# Patient Record
Sex: Female | Born: 1985 | Race: Black or African American | Hispanic: No | Marital: Single | Smoking: Never smoker
Health system: Southern US, Community
[De-identification: ages and names within clinical notes are randomized; demographics above are authoritative.]

## PROBLEM LIST (undated history)

## (undated) DIAGNOSIS — J45909 Unspecified asthma, uncomplicated: Secondary | ICD-10-CM

## (undated) DIAGNOSIS — D259 Leiomyoma of uterus, unspecified: Secondary | ICD-10-CM

## (undated) DIAGNOSIS — I10 Essential (primary) hypertension: Secondary | ICD-10-CM

## (undated) DIAGNOSIS — E0590 Thyrotoxicosis, unspecified without thyrotoxic crisis or storm: Secondary | ICD-10-CM

## (undated) DIAGNOSIS — G43909 Migraine, unspecified, not intractable, without status migrainosus: Secondary | ICD-10-CM

## (undated) HISTORY — PX: NO PAST SURGERIES: SHX2092

## (undated) HISTORY — DX: Unspecified asthma, uncomplicated: J45.909

## (undated) HISTORY — DX: Thyrotoxicosis, unspecified without thyrotoxic crisis or storm: E05.90

## (undated) HISTORY — PX: WISDOM TOOTH EXTRACTION: SHX21

## (undated) HISTORY — DX: Migraine, unspecified, not intractable, without status migrainosus: G43.909

## (undated) HISTORY — DX: Essential (primary) hypertension: I10

---

## 2005-01-01 ENCOUNTER — Emergency Department (HOSPITAL_COMMUNITY): Admission: EM | Admit: 2005-01-01 | Discharge: 2005-01-01 | Payer: Self-pay | Admitting: *Deleted

## 2007-02-23 ENCOUNTER — Emergency Department (HOSPITAL_COMMUNITY): Admission: EM | Admit: 2007-02-23 | Discharge: 2007-02-24 | Payer: Self-pay | Admitting: Emergency Medicine

## 2008-06-16 ENCOUNTER — Emergency Department (HOSPITAL_COMMUNITY): Admission: EM | Admit: 2008-06-16 | Discharge: 2008-06-16 | Payer: Self-pay | Admitting: Emergency Medicine

## 2010-09-11 LAB — STREP A DNA PROBE: Group A Strep Probe: NEGATIVE

## 2010-09-11 LAB — RAPID STREP SCREEN (MED CTR MEBANE ONLY): Streptococcus, Group A Screen (Direct): NEGATIVE

## 2011-03-08 LAB — I-STAT 8, (EC8 V) (CONVERTED LAB)
Acid-base deficit: 1
BUN: 6
Bicarbonate: 25.6 — ABNORMAL HIGH
Chloride: 104
Glucose, Bld: 125 — ABNORMAL HIGH
HCT: 42
Hemoglobin: 14.3
Operator id: 277751
Potassium: 3.6
Sodium: 139
TCO2: 27
pCO2, Ven: 47.7
pH, Ven: 7.337 — ABNORMAL HIGH

## 2011-03-08 LAB — DIFFERENTIAL
Basophils Absolute: 0.1
Basophils Relative: 1
Eosinophils Absolute: 0.3
Eosinophils Relative: 3
Lymphocytes Relative: 13
Lymphs Abs: 1.4
Monocytes Absolute: 1.2 — ABNORMAL HIGH
Monocytes Relative: 11
Neutro Abs: 7.9 — ABNORMAL HIGH
Neutrophils Relative %: 73

## 2011-03-08 LAB — POCT I-STAT CREATININE
Creatinine, Ser: 1
Operator id: 277751

## 2011-03-08 LAB — CBC
HCT: 38.2
Hemoglobin: 12.7
MCHC: 33.2
MCV: 80
Platelets: 250
RBC: 4.78
RDW: 14.3 — ABNORMAL HIGH
WBC: 10.9 — ABNORMAL HIGH

## 2014-07-09 ENCOUNTER — Other Ambulatory Visit: Payer: Self-pay | Admitting: Family Medicine

## 2014-07-09 DIAGNOSIS — E049 Nontoxic goiter, unspecified: Secondary | ICD-10-CM

## 2014-07-13 ENCOUNTER — Ambulatory Visit
Admission: RE | Admit: 2014-07-13 | Discharge: 2014-07-13 | Disposition: A | Discharge status: Home / Self Care | Payer: Self-pay | Source: Ambulatory Visit | Attending: Family Medicine | Admitting: Family Medicine

## 2014-07-13 DIAGNOSIS — E049 Nontoxic goiter, unspecified: Secondary | ICD-10-CM

## 2014-07-21 ENCOUNTER — Ambulatory Visit (INDEPENDENT_AMBULATORY_CARE_PROVIDER_SITE_OTHER): Payer: BLUE CROSS/BLUE SHIELD | Admitting: Internal Medicine

## 2014-07-21 ENCOUNTER — Encounter: Payer: Self-pay | Admitting: Internal Medicine

## 2014-07-21 VITALS — BP 140/91 | HR 83 | Temp 99.2°F | Ht 64.0 in | Wt 193.2 lb

## 2014-07-21 DIAGNOSIS — E0590 Thyrotoxicosis, unspecified without thyrotoxic crisis or storm: Secondary | ICD-10-CM

## 2014-07-21 LAB — T3, FREE: T3, Free: 3.1 pg/mL (ref 2.3–4.2)

## 2014-07-21 LAB — T4, FREE: Free T4: 0.71 ng/dL (ref 0.60–1.60)

## 2014-07-21 LAB — TSH: TSH: 0.11 u[IU]/mL — ABNORMAL LOW (ref 0.35–4.50)

## 2014-07-21 NOTE — Patient Instructions (Signed)
Please stop at the lab.  Please come back for a follow-up appointment in 4 months.  Please let me know if you develop the following symptoms:  Hyperthyroidism The thyroid is a large gland located in the lower front part of your neck. The thyroid helps control metabolism. Metabolism is how your body uses food. It controls metabolism with the hormone thyroxine. When the thyroid is overactive, it produces too much hormone. When this happens, these following problems may occur:   Nervousness  Heat intolerance  Weight loss (in spite of increase food intake)  Diarrhea  Change in hair or skin texture  Palpitations (heart skipping or having extra beats)  Tachycardia (rapid heart rate)  Loss of menstruation (amenorrhea)  Shaking of the hands CAUSES  Grave's Disease (the immune system attacks the thyroid gland). This is the most common cause.  Inflammation of the thyroid gland.  Tumor (usually benign) in the thyroid gland or elsewhere.  Excessive use of thyroid medications (both prescription and 'natural').  Excessive ingestion of Iodine. DIAGNOSIS  To prove hyperthyroidism, your caregiver may do blood tests and ultrasound tests. Sometimes the signs are hidden. It may be necessary for your caregiver to watch this illness with blood tests, either before or after diagnosis and treatment. TREATMENT Short-term treatment There are several treatments to control symptoms. Drugs called beta blockers may give some relief. Drugs that decrease hormone production will provide temporary relief in many people. These measures will usually not give permanent relief. Definitive therapy There are treatments available which can be discussed between you and your caregiver which will permanently treat the problem. These treatments range from surgery (removal of the thyroid), to the use of radioactive iodine (destroys the thyroid by radiation), to the use of antithyroid drugs (interfere with hormone  synthesis). The first two treatments are permanent and usually successful. They most often require hormone replacement therapy for life. This is because it is impossible to remove or destroy the exact amount of thyroid required to make a person euthyroid (normal). HOME CARE INSTRUCTIONS  See your caregiver if the problems you are being treated for get worse. Examples of this would be the problems listed above. SEEK MEDICAL CARE IF: Your general condition worsens. MAKE SURE YOU:   Understand these instructions.  Will watch your condition.  Will get help right away if you are not doing well or get worse. Document Released: 05/14/2005 Document Revised: 08/06/2011 Document Reviewed: 09/25/2006 [facility] Patient Information 2015 [place], [place]. This information is not intended to replace advice given to you by your health care provider. Make sure you discuss any questions you have with your health care provider.

## 2014-07-21 NOTE — Progress Notes (Signed)
Patient ID: Donna Bradshaw, female   DOB: 04/27/1986, 29 y.o.   MRN: [id number]   HPI  Donna Bradshaw is a 29 y.o.-year-old female, referred by her PCP, Dr. Leighton Ruff, for evaluation for thyrotoxicosis.  She had a neck pressure sensation x 2 weeks, then saw PCP. Pt denies feeling nodules in neck, hoarseness, dysphagia/odynophagia, SOB with lying down. No previous URI.  I reviewed pt's thyroid tests: 07/08/2014: TSH 0.03, free T4 2.0 (0.61-1.12)   Patient was sent for thyroid ultrasound (07/13/2014): Prominent thyroid without nodule or other focal lesions. The right lobe was heterogeneous, mildly hyperemic.   she c/o: - + insomnia - now resolved - + fatigue - now resolved - + excessive sweating/heat intolerance - now resolved - no tremors - no anxiety - no palpitations - no hyperdefecation - no weight loss  Pt does not have a FH of thyroid ds. No FH of thyroid cancer. No h/o radiation tx to head or neck.  No seaweed or kelp, no recent contrast studies. No steroid use. No herbal supplements. On Hair skin and nails - not in the day that she had the tests drawn.   ROS: Constitutional: no weight gain/loss, no fatigue, no subjective hyperthermia/hypothermia, + clears her throat often (for a long time, not new) Eyes: no blurry vision, no xerophthalmia ENT: no sore throat, no nodules palpated in throat, no dysphagia/odynophagia, no hoarseness Cardiovascular: no CP/SOB/palpitations/leg swelling Respiratory: no cough/SOB Gastrointestinal: no N/V/D/C Musculoskeletal: no muscle/joint aches Skin: no rashes Neurological: no tremors/numbness/tingling/dizziness Psychiatric: no depression/anxiety  No past medical history.  History reviewed. No pertinent past surgical history.   History   Social History  . Marital Status: Single    Spouse Name: N/A  . Number of Children: 0   Occupational History  . Pharm tech.   Social History Main Topics  . Smoking status: Never Smoker   .  Smokeless tobacco: Not on file  . Alcohol Use: 0.0 oz/week    0 Standard drinks or equivalent per week     Comment: socially  . Drug Use: No   Current Outpatient Rx  Name  Route  Sig  Dispense  Refill  . BIOTIN PO   Oral   Take by mouth. Does not know mg but take tid         . ibuprofen (ADVIL,MOTRIN) 200 MG tablet   Oral   Take 200 mg by mouth every 6 (six) hours as needed.         Marland Kitchen MICROGESTIN 1-20 MG-MCG tablet   Oral   Take 1 tablet by mouth daily.            Dispense as written.   . SUMAtriptan (IMITREX) 100 MG tablet   Oral   Take 100 mg by mouth every 2 (two) hours as needed.           No Known Allergies   Family History  Problem Relation Age of Onset  . Diabetes Maternal Uncle   . Diabetes Maternal Grandmother    PE: BP 140/91 mmHg  Pulse 83  Temp(Src) 99.2 F (37.3 C) (Oral)  Ht 5\' 4"  (1.626 m)  Wt 193 lb 3.2 oz (87.635 kg)  BMI 33.15 kg/m2  LMP 07/18/2014 Wt Readings from Last 3 Encounters:  07/21/14 193 lb 3.2 oz (87.635 kg)   Constitutional: overweight, in NAD Eyes: PERRLA, EOMI, no exophthalmos, no lid lag, no stare ENT: moist mucous membranes, no thyromegaly, no thyroid bruits, no cervical lymphadenopathy Cardiovascular: RRR, No MRG Respiratory: CTA  B Gastrointestinal: abdomen soft, NT, ND, BS+ Musculoskeletal: no deformities, strength intact in all 4 Skin: moist, warm, no rashes Neurological: no tremor with outstretched hands, DTR normal in all 4  ASSESSMENT: 1. Thyrotoxicosis  PLAN:  1. Patient with a recently found low TSH + high fT4, with few thyrotoxic sxs: heat intolerance, insomnia, fatigue, now all resolved. She also had pressure in her neck (that prompted a thyroid ultrasound) >> now resolved. The thyroid ultrasound did not show any abnormalities, but the lobes had increased vascularity. - she does not appear to have exogenous causes for the low TSH.  - We discussed that possible causes of thyrotoxicosis are:  Graves ds     Thyroiditis  (I suspect this) toxic multinodular goiter/ toxic adenoma (I cannot feel nodules at palpation of her thyroid). - I suggested that we check the TSH, fT3 and fT4 and also thyroid stimulating antibodies to screen for Graves' disease.  - If the tests remain abnormal, we may need an uptake and scan to differentiate between the 3 above possible etiologies  - we discussed about possible modalities of treatment for the above conditions, to include methimazole use, radioactive iodine ablation or (last resort) surgery. - I do not feel that we need to add beta blockers at this time, since she is not tachycardic, anxious, or tremulous - I advised her to join my chart to communicate easier - I advised her not to get pregnant until her thyroid tests return to normal. No plans for pregnancy for now. - RTC in 4 months, but likely sooner for repeat labs  Office Visit on 07/21/2014  Component Date Value Ref Range Status  . TSH 07/21/2014 0.11* 0.35 - 4.50 uIU/mL Final  . Free T4 07/21/2014 0.71  0.60 - 1.60 ng/dL Final  . T3, Free 07/21/2014 3.1  2.3 - 4.2 pg/mL Final   Thyroid tests much improved, consistent with a suspected diagnosis of thyroiditis. We'll need to repeat the tests in 6 weeks. No need for a thyroid uptake and scan for now.

## 2014-07-24 LAB — THYROID STIMULATING IMMUNOGLOBULIN: TSI: 26 % baseline (ref <140)

## 2015-03-25 ENCOUNTER — Other Ambulatory Visit: Payer: Self-pay | Admitting: Family Medicine

## 2015-03-25 ENCOUNTER — Other Ambulatory Visit (HOSPITAL_COMMUNITY)
Admission: RE | Admit: 2015-03-25 | Discharge: 2015-03-25 | Disposition: A | Discharge status: Home / Self Care | Payer: BLUE CROSS/BLUE SHIELD | Source: Ambulatory Visit | Attending: Family Medicine | Admitting: Family Medicine

## 2015-03-25 DIAGNOSIS — Z113 Encounter for screening for infections with a predominantly sexual mode of transmission: Secondary | ICD-10-CM | POA: Insufficient documentation

## 2015-03-25 DIAGNOSIS — Z124 Encounter for screening for malignant neoplasm of cervix: Secondary | ICD-10-CM | POA: Diagnosis present

## 2015-03-29 LAB — CYTOLOGY - PAP

## 2015-06-08 ENCOUNTER — Other Ambulatory Visit: Payer: Self-pay | Admitting: Family Medicine

## 2015-06-08 DIAGNOSIS — R102 Pelvic and perineal pain: Secondary | ICD-10-CM

## 2015-06-13 ENCOUNTER — Encounter: Payer: Self-pay | Admitting: *Deleted

## 2015-06-14 ENCOUNTER — Encounter: Payer: Self-pay | Admitting: Neurology

## 2015-06-14 ENCOUNTER — Telehealth: Payer: Self-pay | Admitting: Neurology

## 2015-06-14 ENCOUNTER — Ambulatory Visit (INDEPENDENT_AMBULATORY_CARE_PROVIDER_SITE_OTHER): Payer: BLUE CROSS/BLUE SHIELD | Admitting: Neurology

## 2015-06-14 ENCOUNTER — Encounter: Payer: Self-pay | Admitting: *Deleted

## 2015-06-14 VITALS — BP 141/101 | HR 91 | Ht 64.0 in | Wt 197.6 lb

## 2015-06-14 DIAGNOSIS — G43909 Migraine, unspecified, not intractable, without status migrainosus: Secondary | ICD-10-CM

## 2015-06-14 DIAGNOSIS — G4489 Other headache syndrome: Secondary | ICD-10-CM | POA: Diagnosis not present

## 2015-06-14 DIAGNOSIS — H539 Unspecified visual disturbance: Secondary | ICD-10-CM | POA: Diagnosis not present

## 2015-06-14 DIAGNOSIS — G43009 Migraine without aura, not intractable, without status migrainosus: Secondary | ICD-10-CM | POA: Insufficient documentation

## 2015-06-14 MED ORDER — ZOLMITRIPTAN 5 MG NA SOLN: 1.0000 | NASAL | Status: DC | PRN

## 2015-06-14 MED ORDER — DICLOFENAC POTASSIUM(MIGRAINE) 50 MG PO PACK: 50.0000 mg | PACK | Freq: Once | ORAL | Status: DC | PRN

## 2015-06-14 MED ORDER — SUMATRIPTAN SUCCINATE 11 MG/NOSEPC NA EXHP: 2.0000 | INHALANT_POWDER | Freq: Once | NASAL | Status: DC

## 2015-06-14 NOTE — Progress Notes (Signed)
[id number]    Provider:  Dr Jaynee Eagles Referring Provider: Leighton Ruff, MD Primary Care Physician:  Gerrit Heck, MD  CC:  migraines  HPI:  Donna Bradshaw is a 30 y.o. female here as a referral from Dr. Drema Dallas for migraines. Migraines since her teenage years. She has throbbing behind the right eyeball and unilateral on the right extending to the neck. He has 5 days of migraines a month. She has nausea, she has vomited in the past. She has to go into a dark room which helps, she is sound sensitive and needs silence to sit still. 7/10 in pain. Unknown triggers except maybe around her period. 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 She takes it then by the end of the day the headache is back. She hates the way it makes her feel, like she is heavy and her chest is heavy. Rizatriptan did not help. No aura. She is having vision changes with the headaches.   Reviewed notes, labs and imaging from outside physicians, which showed: reviewed notes from [facility] physicians. Patient has migraines, h/o hyperthyroidism, menstrual irregularity. Was referred for migraine management. She was evaluated in 06/2014 for thyrotoxicosis, She had a neck pressure sensation x 2 weeks, sh ehad a low TSH and high fT4 with few thyrotoxic sxs: heat intolerance, insomnia, fatigue,which resolved suspected thyroiditis.   Review of Systems: Patient complains of symptoms per HPI as well as the following symptoms: no CP, no SOB. Pertinent negatives per HPI. All others negative.   Social History   Social History  . Marital Status: Single    Spouse Name: N/A  . Number of Children: 0  . 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 Smoking status: Never Smoker   . Smokeless tobacco: Not on file  . Alcohol Use: No  . Drug Use: No  . Sexual Activity: Not on file   Other Topics Concern  . Not on file   Social  History Narrative   Lives w/ sister.   Caffeine use: 1-2 times per week    Family History  Problem Relation Age of Onset  . Diabetes Maternal Uncle   . Diabetes Maternal Grandmother   . Cancer    . Seizures    . Migraines Neg Hx     Past Medical History  Diagnosis Date  . Hyperthyroidism   . Migraine     Past Surgical History  Procedure Laterality Date  . No past surgeries      Current Outpatient Prescriptions  Medication Sig Dispense Refill  . ibuprofen (ADVIL,MOTRIN) 200 MG tablet Take 200 mg by mouth every 6 (six) hours as needed.    Marland Kitchen MICROGESTIN 1-20 MG-MCG tablet Take 1 tablet by mouth daily.     . SUMAtriptan (IMITREX) 100 MG tablet Take 100 mg by mouth every 2 (two) hours as needed.     . Diclofenac Potassium 50 MG PACK Take 50 mg by mouth once as needed. Take once daily as needed with headache onset. Please take with food 2 each 0  . SUMAtriptan Succinate (ONZETRA XSAIL) 11 MG/NOSEPC EXHP Place 2 sprays into the nose once. 8 each 11  . zolmitriptan (ZOMIG) 5 MG nasal solution Place 1 spray into the nose as needed for migraine. 2 Units 0   No current facility-administered medications for this visit.    Allergies as of 06/14/2015  . (No Known  Allergies)    Vitals: BP 141/101 mmHg  Pulse 91  Ht 5\' 4"  (1.626 m)  Wt 197 lb 9.6 oz (89.631 kg)  BMI 33.90 kg/m2 Last Weight:  Wt Readings from Last 1 Encounters:  06/14/15 197 lb 9.6 oz (89.631 kg)   Last Height:   Ht Readings from Last 1 Encounters:  06/14/15 5\' 4"  (1.626 m)   Physical exam: Exam: Gen: NAD, conversant, well nourised, obese, well groomed                     CV: RRR, no MRG. No Carotid Bruits. No peripheral edema, warm, nontender Eyes: Conjunctivae clear without exudates or hemorrhage  Neuro: Detailed Neurologic Exam  Speech:    Speech is normal; fluent and spontaneous with normal comprehension.  Cognition:    The patient is oriented to person, place, and time;     recent and remote  memory intact;     language fluent;     normal attention, concentration,     fund of knowledge Cranial Nerves:    The pupils are equal, round, and reactive to light. The fundi are normal and spontaneous venous pulsations are present. Visual fields are full to finger confrontation. Extraocular movements are intact. Trigeminal sensation is intact and the muscles of mastication are normal. The face is symmetric. The palate elevates in the midline. Hearing intact. Voice is normal. Shoulder shrug is normal. The tongue has normal motion without fasciculations.   Coordination:    Normal finger to nose and heel to shin. Normal rapid alternating movements.   Gait:    Heel-toe and tandem gait are normal.   Motor Observation:    No asymmetry, no atrophy, and no involuntary movements noted. Tone:    Normal muscle tone.    Posture:    Posture is normal. normal erect    Strength:    Strength is V/V in the upper and lower limbs.      Sensation: intact to LT     Reflex Exam:  DTR's:    Deep tendon reflexes in the upper and lower extremities are normal bilaterally.   Toes:    The toes are downgoing bilaterally.   Clonus:    Clonus is absent.       Assessment/Plan:  30 year old with migraines   Remember to drink plenty of fluid, eat healthy meals and do not skip any meals. Try to eat protein with a every meal and eat a healthy snack such as fruit or nuts in between meals. Try to keep a regular sleep-wake schedule and try to exercise daily, particularly in the form of walking, 20-30 minutes a day, if you can.   As far as your medications are concerned, I would like to suggest: Can try Onzetra or Cambia or Zomig at onset of headache. Can repeat in 2 hours if needed, no more than 2 in a day or 2 days a week. Let us know which one works the best. Discussed combination therapy for acute management such as cambia + triptan. Will not start daily preventative at this time. As far as diagnostic  testing: MRI of the brain - Has never had images, discussed and will order MRI of the brain - labs  To prevent or relieve headaches, try the following: Cool Compress. Lie down and place a cool compress on your head.  Avoid headache triggers. If certain foods or odors seem to have triggered your migraines in the past, avoid them. A headache diary  might help you identify triggers.  Include physical activity in your daily routine. Try a daily walk or other moderate aerobic exercise.  Manage stress. Find healthy ways to cope with the stressors, such as delegating tasks on your to-do list.  Practice relaxation techniques. Try deep breathing, yoga, massage and visualization.  Eat regularly. Eating regularly scheduled meals and maintaining a healthy diet might help prevent headaches. Also, drink plenty of fluids.  Follow a regular sleep schedule. Sleep deprivation might contribute to headaches Consider biofeedback. With this mind-body technique, you learn to control certain bodily functions - such as muscle tension, heart rate and blood pressure - to prevent headaches or reduce headache pain.    Proceed to emergency room if you experience new or worsening symptoms or symptoms do not resolve, if you have new neurologic symptoms or if headache is severe, or for any concerning symptom.    Sarina Ill, MD  [facility] [address] [place], [place] [address]  Phone [phone] Fax [phone]

## 2015-06-14 NOTE — Progress Notes (Signed)
Faxed onzetra enrollment plus rx to [facility]: [employer]. Received confirmation. Sent copy to MR.

## 2015-06-14 NOTE — Telephone Encounter (Signed)
Patient declined moving forward with the MRI BRAIN WO for now to to her copay of $884.11. Patient is aware of payment plans [employer] offers. Patient explained that she will think about it and call to schedule later. Thanks!

## 2015-06-14 NOTE — Patient Instructions (Addendum)
Overall you are doing fairly well but I do want to suggest a few things today:   Remember to drink plenty of fluid, eat healthy meals and do not skip any meals. Try to eat protein with a every meal and eat a healthy snack such as fruit or nuts in between meals. Try to keep a regular sleep-wake schedule and try to exercise daily, particularly in the form of walking, 20-30 minutes a day, if you can.   As far as your medications are concerned, I would like to suggest: Can try Onzetra or Cambia or Zomig at onset of headache. Can repeat in 2 hours if needed, no more than 2 in a day or 2 days a week.  As far as diagnostic testing: MRI of the brain   Please also call us for any test results so we can go over those with you on the phone.  My clinical assistant and will answer any of your questions and relay your messages to me and also relay most of my messages to you.   Our phone number is [phone]. We also have an after hours call service for urgent matters and there is a physician on-call for urgent questions. For any emergencies you know to call 911 or go to the nearest emergency room

## 2015-06-15 ENCOUNTER — Ambulatory Visit
Admission: RE | Admit: 2015-06-15 | Discharge: 2015-06-15 | Disposition: A | Discharge status: Home / Self Care | Payer: BLUE CROSS/BLUE SHIELD | Source: Ambulatory Visit | Attending: Family Medicine | Admitting: Family Medicine

## 2015-06-15 ENCOUNTER — Telehealth: Payer: Self-pay | Admitting: *Deleted

## 2015-06-15 DIAGNOSIS — R102 Pelvic and perineal pain: Secondary | ICD-10-CM

## 2015-06-15 LAB — COMPREHENSIVE METABOLIC PANEL
ALT: 10 IU/L (ref 0–32)
AST: 10 IU/L (ref 0–40)
Albumin/Globulin Ratio: 1.3 (ref 1.1–2.5)
Albumin: 3.8 g/dL (ref 3.5–5.5)
Alkaline Phosphatase: 39 IU/L (ref 39–117)
BUN/Creatinine Ratio: 9 (ref 8–20)
BUN: 9 mg/dL (ref 6–20)
Bilirubin Total: 0.5 mg/dL (ref 0.0–1.2)
CO2: 22 mmol/L (ref 18–29)
Calcium: 9.1 mg/dL (ref 8.7–10.2)
Chloride: 103 mmol/L (ref 96–106)
Creatinine, Ser: 1 mg/dL (ref 0.57–1.00)
GFR calc Af Amer: 87 mL/min/{1.73_m2} (ref >59)
GFR calc non Af Amer: 75 mL/min/{1.73_m2} (ref >59)
Globulin, Total: 2.9 g/dL (ref 1.5–4.5)
Glucose: 99 mg/dL (ref 65–99)
Potassium: 5 mmol/L (ref 3.5–5.2)
Sodium: 140 mmol/L (ref 134–144)
Total Protein: 6.7 g/dL (ref 6.0–8.5)

## 2015-06-15 NOTE — Telephone Encounter (Signed)
-----   Message from Melvenia Beam, MD sent at 06/15/2015  8:27 AM EST ----- Labs normal thanks

## 2015-06-15 NOTE — Telephone Encounter (Signed)
Spoke to pt about normal labs per Dr Jaynee Eagles. PT verbalized understanding.

## 2015-06-20 NOTE — Progress Notes (Signed)
Received update from advanced pt services through [place]. PA was approved dated 06/20/15 for onzetra. Copay is 183.99. Savings card brings it down to 58.99.

## 2015-06-20 NOTE — Addendum Note (Signed)
Addended by: Rossie Muskrat L on: 06/20/2015 02:07 PM   Modules accepted: Medications

## 2015-06-21 NOTE — Progress Notes (Signed)
Received notice from [facility] that onzetra xsail aer pow ba approved effective 05/30/2015-06/19/2016.

## 2015-07-11 ENCOUNTER — Other Ambulatory Visit (HOSPITAL_COMMUNITY)
Admission: RE | Admit: 2015-07-11 | Discharge: 2015-07-11 | Disposition: A | Discharge status: Home / Self Care | Payer: BLUE CROSS/BLUE SHIELD | Source: Ambulatory Visit | Attending: Obstetrics and Gynecology | Admitting: Obstetrics and Gynecology

## 2015-07-11 ENCOUNTER — Other Ambulatory Visit: Payer: Self-pay | Admitting: Obstetrics and Gynecology

## 2015-07-11 DIAGNOSIS — Z1151 Encounter for screening for human papillomavirus (HPV): Secondary | ICD-10-CM | POA: Diagnosis present

## 2015-07-12 LAB — CERVICOVAGINAL ANCILLARY ONLY: HPV: NOT DETECTED

## 2015-09-01 ENCOUNTER — Ambulatory Visit (INDEPENDENT_AMBULATORY_CARE_PROVIDER_SITE_OTHER): Payer: BLUE CROSS/BLUE SHIELD | Admitting: Family Medicine

## 2015-09-01 VITALS — BP 118/82 | HR 104 | Temp 98.2°F | Resp 18 | Ht 64.0 in | Wt 190.4 lb

## 2015-09-01 DIAGNOSIS — J038 Acute tonsillitis due to other specified organisms: Secondary | ICD-10-CM

## 2015-09-01 DIAGNOSIS — J0300 Acute streptococcal tonsillitis, unspecified: Secondary | ICD-10-CM | POA: Diagnosis not present

## 2015-09-01 LAB — POCT RAPID STREP A (OFFICE): Rapid Strep A Screen: POSITIVE — AB

## 2015-09-01 MED ORDER — AMOXICILLIN 500 MG PO CAPS: 1000.0000 mg | ORAL_CAPSULE | Freq: Two times a day (BID) | ORAL | Status: DC

## 2015-09-01 NOTE — Progress Notes (Signed)
Subjective:    Patient ID: Donna Bradshaw, female    DOB: Apr 21, 1986, 30 y.o.   MRN: [id number]  09/01/2015  Flu Like Symptoms   HPI This 30 y.o. female presents for evaluation of acute illness. Onset four days ago.  +fever Tmax unsure; +chills/sweats; +body aches.  +HA mild.  +ear pain L.  +ST persistent.  White spots on throat.  No rhinorrhea; no nasal congestion; no cough.  No SOB.  No vomiting; +diarrhea x 2 per day.  Mild pain with swallowing.  Pharmacy tech.  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     Review of Systems  Constitutional: Positive for chills, diaphoresis and fatigue. Negative for fever.  HENT: Positive for ear pain and sore throat. Negative for postnasal drip, rhinorrhea, sinus pressure and trouble swallowing.   Respiratory: Negative for cough and shortness of breath.   Cardiovascular: Negative for chest pain, palpitations and leg swelling.  Gastrointestinal: Positive for diarrhea. Negative for nausea, vomiting, abdominal pain and constipation.  Neurological: Positive for headaches.    Past Medical History  Diagnosis Date  . Hyperthyroidism   . Migraine    Past Surgical History  Procedure Laterality Date  . No past surgeries     No Known Allergies  Social History   Social History  . Marital Status: Single    Spouse Name: N/A  . Number of Children: 0  . 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 Smoking status: Never Smoker   . Smokeless tobacco: Not on file  . Alcohol Use: No  . Drug Use: No  . Sexual Activity: Not on file   Other Topics Concern  . Not on file   Social History Narrative   Lives w/ sister.   Caffeine use: 1-2 times per week   Family History  Problem Relation Age of Onset  . Diabetes Maternal Uncle   . Diabetes Maternal Grandmother   . Cancer    . Seizures    . Migraines Neg Hx        Objective:    BP 118/82 mmHg  Pulse 104  Temp(Src) 98.2 F (36.8 C) (Oral)   Resp 18  Ht 5\' 4"  (1.626 m)  Wt 190 lb 6.4 oz (86.365 kg)  BMI 32.67 kg/m2  SpO2 98%  LMP 08/18/2015 Physical Exam  Constitutional: She is oriented to person, place, and time. She appears well-developed and well-nourished. No distress.  HENT:  Head: Normocephalic and atraumatic.  Right Ear: Tympanic membrane, external ear and ear canal normal.  Left Ear: Tympanic membrane, external ear and ear canal normal.  Mouth/Throat: Uvula is midline and mucous membranes are normal. Oropharyngeal exudate and posterior oropharyngeal erythema present. No posterior oropharyngeal edema or tonsillar abscesses.  Eyes: Conjunctivae are normal. Pupils are equal, round, and reactive to light.  Neck: Normal range of motion. Neck supple.  Cardiovascular: Normal rate, regular rhythm and normal heart sounds.  Exam reveals no gallop and no friction rub.   No murmur heard. Pulmonary/Chest: Effort normal and breath sounds normal. She has no wheezes. She has no rales.  Neurological: She is alert and oriented to person, place, and time.  Skin: She is not diaphoretic.  Psychiatric: She has a normal mood and affect. Her behavior is normal.  Nursing note and vitals reviewed.  Results for orders placed or performed in visit on 09/01/15  POCT rapid strep A  Result Value Ref Range   Rapid Strep  A Screen Positive (A) Negative       Assessment & Plan:   1. Strep tonsillitis   2. Acute tonsillitis due to other specified organisms     Orders Placed This Encounter  Procedures  . POCT rapid strep A   Meds ordered this encounter  Medications  . amoxicillin (AMOXIL) 500 MG capsule    Sig: Take 2 capsules (1,000 mg total) by mouth 2 (two) times daily.    Dispense:  40 capsule    Refill:  0    No Follow-up on file.    Austine Kelsay Elayne Guerin, M.D. [facility] [address] [place], [place]  [address] [phone] phone [phone] fax

## 2015-09-01 NOTE — Patient Instructions (Addendum)
IF you received an x-ray today, you will receive an invoice from [facility]. Please contact [facility] at [phone] with questions or concerns regarding your invoice.   IF you received labwork today, you will receive an invoice from [employer]. Please contact [facility] at [phone] with questions or concerns regarding your invoice.   Our billing staff will not be able to assist you with questions regarding bills from these companies.  You will be contacted with the lab results as soon as they are available. The fastest way to get your results is to activate your My Chart account. Instructions are located on the last page of this paperwork. If you have not heard from [place] regarding the results in 2 weeks, please contact this office.    Strep Throat Strep throat is a bacterial infection of the throat. Your health care provider may call the infection tonsillitis or pharyngitis, depending on whether there is swelling in the tonsils or at the back of the throat. Strep throat is most common during the cold months of the year in children who are 45-65 years of age, but it can happen during any season in people of any age. This infection is spread from person to person (contagious) through coughing, sneezing, or close contact. CAUSES Strep throat is caused by the bacteria called Streptococcus pyogenes. RISK FACTORS This condition is more likely to develop in:  People who spend time in crowded places where the infection can spread easily.  People who have close contact with someone who has strep throat. SYMPTOMS Symptoms of this condition include:  Fever or chills.   Redness, swelling, or pain in the tonsils or throat.  Pain or difficulty when swallowing.  White or yellow spots on the tonsils or throat.  Swollen, tender glands in the neck or under the jaw.  Red rash all over the body (rare). DIAGNOSIS This condition is diagnosed by  performing a rapid strep test or by taking a swab of your throat (throat culture test). Results from a rapid strep test are usually ready in a few minutes, but throat culture test results are available after one or two days. TREATMENT This condition is treated with antibiotic medicine. HOME CARE INSTRUCTIONS Medicines  Take over-the-counter and prescription medicines only as told by your health care provider.  Take your antibiotic as told by your health care provider. Do not stop taking the antibiotic even if you start to feel better.  Have family members who also have a sore throat or fever tested for strep throat. They may need antibiotics if they have the strep infection. Eating and Drinking  Do not share food, drinking cups, or personal items that could cause the infection to spread to other people.  If swallowing is difficult, try eating soft foods until your sore throat feels better.  Drink enough fluid to keep your urine clear or pale yellow. General Instructions  Gargle with a salt-water mixture 3-4 times per day or as needed. To make a salt-water mixture, completely dissolve -1 tsp of salt in 1 cup of warm water.  Make sure that all household members wash their hands well.  Get plenty of rest.  Stay home from school or work until you have been taking antibiotics for 24 hours.  Keep all follow-up visits as told by your health care provider. This is important. SEEK MEDICAL CARE IF:  The glands in your neck continue to get bigger.  You develop a rash, cough, or earache.  You cough up a thick liquid that is green, yellow-brown, or bloody.  You have pain or discomfort that does not get better with medicine.  Your problems seem to be getting worse rather than better.  You have a fever. SEEK IMMEDIATE MEDICAL CARE IF:  You have new symptoms, such as vomiting, severe headache, stiff or painful neck, chest pain, or shortness of breath.  You have severe throat pain,  drooling, or changes in your voice.  You have swelling of the neck, or the skin on the neck becomes red and tender.  You have signs of dehydration, such as fatigue, dry mouth, and decreased urination.  You become increasingly sleepy, or you cannot wake up completely.  Your joints become red or painful.   This information is not intended to replace advice given to you by your health care provider. Make sure you discuss any questions you have with your health care provider.   Document Released: 05/11/2000 Document Revised: 02/02/2015 Document Reviewed: 09/06/2014 Elsevier Interactive Patient Education [employer].

## 2015-09-14 ENCOUNTER — Ambulatory Visit: Payer: BLUE CROSS/BLUE SHIELD | Admitting: Neurology

## 2015-12-19 ENCOUNTER — Encounter (HOSPITAL_COMMUNITY): Payer: Self-pay | Admitting: Obstetrics and Gynecology

## 2015-12-19 DIAGNOSIS — G43909 Migraine, unspecified, not intractable, without status migrainosus: Secondary | ICD-10-CM | POA: Insufficient documentation

## 2015-12-19 DIAGNOSIS — D259 Leiomyoma of uterus, unspecified: Secondary | ICD-10-CM | POA: Insufficient documentation

## 2016-03-13 ENCOUNTER — Encounter (HOSPITAL_COMMUNITY): Payer: Self-pay

## 2016-03-13 ENCOUNTER — Emergency Department (HOSPITAL_COMMUNITY)
Admission: EM | Admit: 2016-03-13 | Discharge: 2016-03-13 | Disposition: A | Discharge status: Home / Self Care | Payer: BLUE CROSS/BLUE SHIELD | Attending: Emergency Medicine | Admitting: Emergency Medicine

## 2016-03-13 ENCOUNTER — Emergency Department (HOSPITAL_COMMUNITY): Payer: BLUE CROSS/BLUE SHIELD

## 2016-03-13 DIAGNOSIS — R079 Chest pain, unspecified: Secondary | ICD-10-CM | POA: Diagnosis present

## 2016-03-13 DIAGNOSIS — E010 Iodine-deficiency related diffuse (endemic) goiter: Secondary | ICD-10-CM | POA: Insufficient documentation

## 2016-03-13 HISTORY — DX: Leiomyoma of uterus, unspecified: D25.9

## 2016-03-13 LAB — I-STAT TROPONIN, ED
Troponin i, poc: 0 ng/mL (ref 0.00–0.08)
Troponin i, poc: 0 ng/mL (ref 0.00–0.08)

## 2016-03-13 LAB — CBC
HCT: 38.4 % (ref 36.0–46.0)
Hemoglobin: 13.3 g/dL (ref 12.0–15.0)
MCH: 29.9 pg (ref 26.0–34.0)
MCHC: 34.6 g/dL (ref 30.0–36.0)
MCV: 86.3 fL (ref 78.0–100.0)
Platelets: 275 10*3/uL (ref 150–400)
RBC: 4.45 MIL/uL (ref 3.87–5.11)
RDW: 12.2 % (ref 11.5–15.5)
WBC: 6.8 10*3/uL (ref 4.0–10.5)

## 2016-03-13 LAB — BASIC METABOLIC PANEL
Anion gap: 4 — ABNORMAL LOW (ref 5–15)
BUN: 5 mg/dL — ABNORMAL LOW (ref 6–20)
CO2: 26 mmol/L (ref 22–32)
Calcium: 8.9 mg/dL (ref 8.9–10.3)
Chloride: 108 mmol/L (ref 101–111)
Creatinine, Ser: 0.84 mg/dL (ref 0.44–1.00)
GFR calc Af Amer: >60 mL/min (ref >60)
GFR calc non Af Amer: >60 mL/min (ref >60)
Glucose, Bld: 86 mg/dL (ref 65–99)
Potassium: 3.9 mmol/L (ref 3.5–5.1)
Sodium: 138 mmol/L (ref 135–145)

## 2016-03-13 LAB — D-DIMER, QUANTITATIVE: D-Dimer, Quant: 0.4 ug/mL-FEU (ref 0.00–0.50)

## 2016-03-13 NOTE — ED Notes (Signed)
EDP at bedside  

## 2016-03-13 NOTE — ED Notes (Signed)
Patient states the chest pain is intermittent and mild and started 2 weeks ago. She also feels the pressure in her throat. Currently the patient is not having any pain.

## 2016-03-13 NOTE — ED Triage Notes (Signed)
Onset 1 week pressure in throat and right chest with movement.  Pt reports some shortness of breath and fatigue.  No cough/cold/fever symptoms or known injury.  Pt reports pressure in throat last year, thyroid levels were elevated, referred to endocrinologist, thyroid level normal and u/s done.

## 2016-03-13 NOTE — ED Provider Notes (Signed)
[facility] DEPT Provider Note   CSN: [id number] Arrival date & time: 03/13/16  1359     History   Chief Complaint Chief Complaint  Patient presents with  . Chest Pain    HPI Donna Bradshaw is a 30 y.o. female.  The history is provided by the patient.  Chest Pain   This is a new problem. Episode onset: 2 weeks ago. The problem occurs constantly (The throat pressure is constant.  The chest comes and goes.  Mostly when up and moving). The pain is associated with exertion. The pain is present in the lateral region (right). The pain is mild. The symptoms are aggravated by exertion. Associated symptoms include cough and shortness of breath. Pertinent negatives include no abdominal pain, no fever and no vomiting. Associated symptoms comments: fatigue. She has tried nothing for the symptoms. The treatment provided no relief. Risk factors include oral contraceptive use.  Pertinent negatives for past medical history include no COPD, no diabetes, no DVT and no MI.    Past Medical History:  Diagnosis Date  . Hyperthyroidism   . Migraine   . Uterine fibroid     Patient Active Problem List   Diagnosis Date Noted  . Migraine without aura 06/14/2015  . Thyrotoxicosis 07/21/2014    Past Surgical History:  Procedure Laterality Date  . NO PAST SURGERIES      OB History    No data available       Home Medications    Prior to Admission medications   Medication Sig Start Date End Date Taking? Authorizing Provider  ibuprofen (ADVIL,MOTRIN) 200 MG tablet Take 200 mg by mouth every 6 (six) hours as needed.   Yes Historical Provider, MD  MICROGESTIN 1-20 MG-MCG tablet Take 1 tablet by mouth daily.  07/20/14  Yes Historical Provider, MD  SUMAtriptan Succinate (ONZETRA XSAIL) 11 MG/NOSEPC EXHP Place 2 sprays into the nose once. 06/14/15  Yes Melvenia Beam, MD  amoxicillin (AMOXIL) 500 MG capsule Take 2 capsules (1,000 mg total) by mouth 2 (two) times daily. Patient not taking:  Reported on 03/13/2016 09/01/15   Wardell Honour, MD  Diclofenac Potassium 50 MG PACK Take 50 mg by mouth once as needed. Take once daily as needed with headache onset. Please take with food Patient not taking: Reported on 03/13/2016 06/14/15   Melvenia Beam, MD  zolmitriptan (ZOMIG) 5 MG nasal solution Place 1 spray into the nose as needed for migraine. Patient not taking: Reported on 03/13/2016 06/14/15   Melvenia Beam, MD    Family History Family History  Problem Relation Age of Onset  . Diabetes Maternal Uncle   . Diabetes Maternal Grandmother   . Cancer    . Seizures    . Migraines Neg Hx     Social History Social History  Substance Use Topics  . Smoking status: Never Smoker  . Smokeless tobacco: Never Used  . Alcohol use 0.0 oz/week     Comment: occ     Allergies   Review of patient's allergies indicates no known allergies.   Review of Systems Review of Systems  Constitutional: Negative for fever.  Respiratory: Positive for cough and shortness of breath.   Cardiovascular: Positive for chest pain.  Gastrointestinal: Negative for abdominal pain and vomiting.  All other systems reviewed and are negative.    Physical Exam Updated Vital Signs BP 125/89   Pulse 82   Temp 98.1 F (36.7 C)   Resp 16   Ht 5\' 3"  (  1.6 m)   LMP 02/26/2016   SpO2 100%   Physical Exam  Constitutional: She appears well-developed and well-nourished. No distress.  HENT:  Head: Normocephalic and atraumatic.  Right Ear: External ear normal.  Left Ear: External ear normal.  Eyes: Conjunctivae are normal. Right eye exhibits no discharge. Left eye exhibits no discharge. No scleral icterus.  Neck: Neck supple. No tracheal deviation present. Thyromegaly present.  Cardiovascular: Normal rate, regular rhythm and intact distal pulses.   Pulmonary/Chest: Effort normal and breath sounds normal. No stridor. No respiratory distress. She has no wheezes. She has no rales.  Abdominal: Soft. Bowel  sounds are normal. She exhibits no distension. There is no tenderness. There is no rebound and no guarding.  Musculoskeletal: She exhibits no edema or tenderness.  Neurological: She is alert. She has normal strength. No cranial nerve deficit (no facial droop, extraocular movements intact, no slurred speech) or sensory deficit. She exhibits normal muscle tone. She displays no seizure activity. Coordination normal.  Skin: Skin is warm and dry. No rash noted.  Psychiatric: She has a normal mood and affect.  Nursing note and vitals reviewed.    ED Treatments / Results  Labs (all labs ordered are listed, but only abnormal results are displayed) Labs Reviewed  BASIC METABOLIC PANEL - Abnormal; Notable for the following:       Result Value   BUN 5 (*)    Anion gap 4 (*)    All other components within normal limits  CBC  Randolm Idol, ED     Radiology Dg Chest 2 View  Result Date: 03/13/2016 CLINICAL DATA:  30 year old female with chest and throat pressure for 2 weeks. Shortness breath at times. Nonsmoker. Asthma as child. Initial encounter. EXAM: CHEST  2 VIEW COMPARISON:  02/23/2007 chest x-ray. FINDINGS: No infiltrate, congestive heart failure or pneumothorax. Mediastinal and cardiac silhouette within normal limits. Minimal curvature thoracic spine convex right. IMPRESSION: No acute pulmonary abnormality. Electronically Signed   By: Genia Del M.D.   On: 03/13/2016 16:04    Procedures Procedures (including critical care time)  Medications Ordered in ED Medications - No data to display   Initial Impression / Assessment and Plan / ED Course  I have reviewed the triage vital signs and the nursing notes.  Pertinent labs & imaging results that were available during my care of the patient were reviewed by me and considered in my medical decision making (see chart for details).  Clinical Course    Patient presented to the emergency room with complaints of chest pain. She is  low risk for cardiac disease. I doubt acute coronary syndrome. Troponin 2 is negative. D-dimer is negative. She does have thyromegaly on exam. She previously had a thyroid ultrasound over 1 year ago.  Results showed prominent thyroid , no nodule..  According to the patient she had outpatient thyroid studies were normal. Discussed outpatient follow-up with her primary doctor. Consider repeat thyroid ultrasound and thyroid studies.  Final Clinical Impressions(s) / ED Diagnoses   Final diagnoses:  Chest pain, unspecified type  Thyromegaly    New Prescriptions New Prescriptions   No medications on file     Dorie Rank, MD 03/13/16 1930

## 2016-03-13 NOTE — Discharge Instructions (Signed)
Follow-up with your primary doctor for further evaluation as we discussed.

## 2016-07-18 ENCOUNTER — Other Ambulatory Visit: Payer: Self-pay | Admitting: Neurology

## 2016-07-18 DIAGNOSIS — G43909 Migraine, unspecified, not intractable, without status migrainosus: Secondary | ICD-10-CM

## 2016-09-17 ENCOUNTER — Ambulatory Visit: Payer: BLUE CROSS/BLUE SHIELD | Admitting: Neurology

## 2016-09-17 ENCOUNTER — Telehealth: Payer: Self-pay

## 2016-09-17 NOTE — Telephone Encounter (Signed)
Pt no-showed her 1 yr f/u appt today.

## 2016-09-18 ENCOUNTER — Encounter: Payer: Self-pay | Admitting: Neurology

## 2016-11-29 ENCOUNTER — Other Ambulatory Visit: Payer: Self-pay | Admitting: Obstetrics & Gynecology

## 2016-11-29 DIAGNOSIS — R922 Inconclusive mammogram: Secondary | ICD-10-CM

## 2016-11-29 DIAGNOSIS — N644 Mastodynia: Secondary | ICD-10-CM

## 2016-12-04 ENCOUNTER — Other Ambulatory Visit: Payer: BLUE CROSS/BLUE SHIELD

## 2016-12-06 ENCOUNTER — Ambulatory Visit
Admission: RE | Admit: 2016-12-06 | Discharge: 2016-12-06 | Disposition: A | Discharge status: Home / Self Care | Payer: BLUE CROSS/BLUE SHIELD | Source: Ambulatory Visit | Attending: Obstetrics & Gynecology | Admitting: Obstetrics & Gynecology

## 2016-12-06 DIAGNOSIS — R922 Inconclusive mammogram: Secondary | ICD-10-CM

## 2016-12-06 DIAGNOSIS — N644 Mastodynia: Secondary | ICD-10-CM

## 2017-02-20 NOTE — Patient Instructions (Signed)
Your procedure is scheduled on:  Tuesday, Oct. 9, 2018  Enter through the [employer] [facility] at:  6:00 AM  Pick up the phone at the desk and dial [phone].  Call this number if you have problems the morning of surgery: [phone].  Remember: Do NOT eat food or drink after:  Midnight Monday  Take these medicines the morning of surgery with a SIP OF WATER:  None  Stop ALL herbal medications at this time  Do NOT smoke the day of surgery.  Do NOT wear jewelry (body piercing), metal hair clips/bobby pins, make-up, artifical eyelashes or nail polish. Do NOT wear lotions, powders, or perfumes.  You may wear deodorant. Do NOT shave for 48 hours prior to surgery. Do NOT bring valuables to the hospital. Contacts, dentures, or bridgework may not be worn into surgery.  Leave suitcase in car.  After surgery it may be brought to your room.  For patients admitted to the hospital, checkout time is 11:00 AM the day of discharge.  Bring a copy of your healthcare power of attorney and living will documents.

## 2017-02-25 ENCOUNTER — Encounter (HOSPITAL_COMMUNITY)
Admission: RE | Admit: 2017-02-25 | Discharge: 2017-02-25 | Disposition: A | Discharge status: Home / Self Care | Payer: BLUE CROSS/BLUE SHIELD | Source: Ambulatory Visit | Attending: Obstetrics & Gynecology | Admitting: Obstetrics & Gynecology

## 2017-02-25 ENCOUNTER — Encounter (HOSPITAL_COMMUNITY): Payer: Self-pay

## 2017-02-25 DIAGNOSIS — Z01812 Encounter for preprocedural laboratory examination: Secondary | ICD-10-CM | POA: Insufficient documentation

## 2017-02-25 LAB — TYPE AND SCREEN
ABO/RH(D): B POS
Antibody Screen: NEGATIVE

## 2017-02-25 LAB — CBC
HCT: 39.3 % (ref 36.0–46.0)
Hemoglobin: 13.7 g/dL (ref 12.0–15.0)
MCH: 30.5 pg (ref 26.0–34.0)
MCHC: 34.9 g/dL (ref 30.0–36.0)
MCV: 87.5 fL (ref 78.0–100.0)
Platelets: 258 10*3/uL (ref 150–400)
RBC: 4.49 MIL/uL (ref 3.87–5.11)
RDW: 12.9 % (ref 11.5–15.5)
WBC: 4.4 10*3/uL (ref 4.0–10.5)

## 2017-02-25 LAB — COMPREHENSIVE METABOLIC PANEL
ALT: 17 U/L (ref 14–54)
AST: 19 U/L (ref 15–41)
Albumin: 3.7 g/dL (ref 3.5–5.0)
Alkaline Phosphatase: 32 U/L — ABNORMAL LOW (ref 38–126)
Anion gap: 6 (ref 5–15)
BUN: 6 mg/dL (ref 6–20)
CO2: 24 mmol/L (ref 22–32)
Calcium: 8.6 mg/dL — ABNORMAL LOW (ref 8.9–10.3)
Chloride: 104 mmol/L (ref 101–111)
Creatinine, Ser: 1.01 mg/dL — ABNORMAL HIGH (ref 0.44–1.00)
GFR calc Af Amer: >60 mL/min (ref >60)
GFR calc non Af Amer: >60 mL/min (ref >60)
Glucose, Bld: 96 mg/dL (ref 65–99)
Potassium: 3.7 mmol/L (ref 3.5–5.1)
Sodium: 134 mmol/L — ABNORMAL LOW (ref 135–145)
Total Bilirubin: 1 mg/dL (ref 0.3–1.2)
Total Protein: 7.2 g/dL (ref 6.5–8.1)

## 2017-02-25 LAB — ABO/RH: ABO/RH(D): B POS

## 2017-03-04 ENCOUNTER — Encounter (HOSPITAL_COMMUNITY): Payer: Self-pay

## 2017-03-04 NOTE — H&P (Signed)
Donna Bradshaw is an 32 y.o. female with symptomatic uterine fibroids desiring surgical management with preservation of fertility.  She reports bulk symptoms with pelvic ache and urinary frequency (nl urinary work up).  Ultrasound shows 498 cc uterine volume with multiple fibroids; largest measuring 10 cm.  Pertinent Gynecological History: Menses: flow is moderate Bleeding: regular Contraception: OCP (estrogen/progesterone) DES exposure: unknown Blood transfusions: none Sexually transmitted diseases: no past history Previous GYN Procedures: n/a  Last mammogram: normal Date: 11/2016 Last pap: normal Date: 11/2016 OB History: G0   Menstrual History: Menarche age: n/a No LMP recorded.    Past Medical History:  Diagnosis Date  . Hyperthyroidism    no longer an issue  . Migraine   . Uterine fibroid     Past Surgical History:  Procedure Laterality Date  . NO PAST SURGERIES    . WISDOM TOOTH EXTRACTION      Family History  Problem Relation Age of Onset  . Diabetes Maternal Uncle   . Diabetes Maternal Grandmother   . Cancer Unknown   . Seizures Unknown   . Breast cancer Paternal Grandmother   . Migraines Neg Hx     Social History:  reports that she has never smoked. She has never used smokeless tobacco. She reports that she drinks alcohol. She reports that she does not use drugs.  Allergies: No Known Allergies  No prescriptions prior to admission.    ROS  There were no vitals taken for this visit. Physical Exam  Constitutional: She is oriented to person, place, and time. She appears well-developed and well-nourished.  GI: Soft. There is no rebound and no guarding.  Neurological: She is alert and oriented to person, place, and time.  Skin: Skin is warm and dry.  Psychiatric: She has a normal mood and affect. Her behavior is normal.    No results found for this or any previous visit (from the past 24 hour(s)).  No results found.  Assessment/Plan: 31yo G0 with  symptomatic uterine leiomyomata -Abdominal myomectomy -Patient is counseled re: risk of bleeding, infection, scarring and damage to surrounding structures.  She understands the need to delivery be C/S at 38 weeks if she becomes pregnant.  She also understands the possibility of growth of new fibroids after the surgery.  All questions were answered and the patient wishes to proceed.  Gracy Ehly, [place] 03/04/2017, 8:48 PM

## 2017-03-05 ENCOUNTER — Encounter (HOSPITAL_COMMUNITY): Payer: Self-pay | Admitting: Emergency Medicine

## 2017-03-05 ENCOUNTER — Encounter (HOSPITAL_COMMUNITY)
Admission: RE | Disposition: A | Discharge status: Home / Self Care | Payer: Self-pay | Source: Ambulatory Visit | Attending: Obstetrics & Gynecology

## 2017-03-05 ENCOUNTER — Inpatient Hospital Stay (HOSPITAL_COMMUNITY): Payer: BLUE CROSS/BLUE SHIELD | Admitting: Certified Registered Nurse Anesthetist

## 2017-03-05 ENCOUNTER — Observation Stay (HOSPITAL_COMMUNITY)
Admission: RE | Admit: 2017-03-05 | Discharge: 2017-03-06 | Disposition: A | Discharge status: Home / Self Care | Payer: BLUE CROSS/BLUE SHIELD | Source: Ambulatory Visit | Attending: Obstetrics & Gynecology | Admitting: Obstetrics & Gynecology

## 2017-03-05 DIAGNOSIS — D251 Intramural leiomyoma of uterus: Secondary | ICD-10-CM | POA: Diagnosis not present

## 2017-03-05 DIAGNOSIS — D252 Subserosal leiomyoma of uterus: Secondary | ICD-10-CM | POA: Diagnosis not present

## 2017-03-05 DIAGNOSIS — Z9889 Other specified postprocedural states: Secondary | ICD-10-CM

## 2017-03-05 DIAGNOSIS — D259 Leiomyoma of uterus, unspecified: Secondary | ICD-10-CM | POA: Diagnosis present

## 2017-03-05 HISTORY — PX: MYOMECTOMY: SHX85

## 2017-03-05 LAB — PREGNANCY, URINE: Preg Test, Ur: NEGATIVE

## 2017-03-05 SURGERY — MYOMECTOMY, ABDOMINAL APPROACH
Anesthesia: General | Site: Abdomen

## 2017-03-05 MED ORDER — SUGAMMADEX SODIUM 200 MG/2ML IV SOLN
INTRAVENOUS | Status: AC
  Filled 2017-03-05: qty 2

## 2017-03-05 MED ORDER — CEFAZOLIN SODIUM-DEXTROSE 2-4 GM/100ML-% IV SOLN
INTRAVENOUS | Status: AC
  Filled 2017-03-05: qty 100

## 2017-03-05 MED ORDER — MIDAZOLAM HCL 2 MG/2ML IJ SOLN
INTRAMUSCULAR | Status: AC
  Filled 2017-03-05: qty 2

## 2017-03-05 MED ORDER — KETOROLAC TROMETHAMINE 30 MG/ML IJ SOLN
INTRAMUSCULAR | Status: AC
  Filled 2017-03-05: qty 1

## 2017-03-05 MED ORDER — SCOPOLAMINE 1 MG/3DAYS TD PT72
1.0000 | MEDICATED_PATCH | Freq: Once | TRANSDERMAL | Status: DC
  Administered 2017-03-05: 1.5 mg via TRANSDERMAL

## 2017-03-05 MED ORDER — FENTANYL CITRATE (PF) 250 MCG/5ML IJ SOLN
INTRAMUSCULAR | Status: AC
  Filled 2017-03-05: qty 5

## 2017-03-05 MED ORDER — LIDOCAINE HCL (CARDIAC) 20 MG/ML IV SOLN
INTRAVENOUS | Status: AC
  Filled 2017-03-05: qty 5

## 2017-03-05 MED ORDER — DEXAMETHASONE SODIUM PHOSPHATE 10 MG/ML IJ SOLN
INTRAMUSCULAR | Status: AC
  Filled 2017-03-05: qty 1

## 2017-03-05 MED ORDER — SIMETHICONE 80 MG PO CHEW
80.0000 mg | CHEWABLE_TABLET | Freq: Four times a day (QID) | ORAL | Status: DC | PRN
  Administered 2017-03-05 – 2017-03-06 (×2): 80 mg via ORAL
  Filled 2017-03-05 (×2): qty 1

## 2017-03-05 MED ORDER — ALUM & MAG HYDROXIDE-SIMETH 200-200-20 MG/5ML PO SUSP: 30.0000 mL | ORAL | Status: DC | PRN

## 2017-03-05 MED ORDER — SCOPOLAMINE 1 MG/3DAYS TD PT72
MEDICATED_PATCH | TRANSDERMAL | Status: AC
  Administered 2017-03-05: 1.5 mg via TRANSDERMAL
  Filled 2017-03-05: qty 1

## 2017-03-05 MED ORDER — KETOROLAC TROMETHAMINE 30 MG/ML IJ SOLN
30.0000 mg | Freq: Four times a day (QID) | INTRAMUSCULAR | Status: DC | PRN
  Administered 2017-03-05: 30 mg via INTRAVENOUS
  Filled 2017-03-05: qty 1

## 2017-03-05 MED ORDER — CEFAZOLIN SODIUM-DEXTROSE 2-4 GM/100ML-% IV SOLN
2.0000 g | INTRAVENOUS | Status: AC
  Administered 2017-03-05: 2 g via INTRAVENOUS

## 2017-03-05 MED ORDER — DOCUSATE SODIUM 100 MG PO CAPS
100.0000 mg | ORAL_CAPSULE | Freq: Two times a day (BID) | ORAL | Status: DC
  Administered 2017-03-05 – 2017-03-06 (×2): 100 mg via ORAL
  Filled 2017-03-05 (×2): qty 1

## 2017-03-05 MED ORDER — PROPOFOL 10 MG/ML IV BOLUS
INTRAVENOUS | Status: AC
  Filled 2017-03-05: qty 20

## 2017-03-05 MED ORDER — PROPOFOL 10 MG/ML IV BOLUS
INTRAVENOUS | Status: DC | PRN
Start: 2017-03-05 — End: 2017-03-05
  Administered 2017-03-05: 200 mg via INTRAVENOUS

## 2017-03-05 MED ORDER — HYDROMORPHONE HCL 1 MG/ML IJ SOLN
0.2000 mg | INTRAMUSCULAR | Status: DC | PRN
  Administered 2017-03-05 (×2): 0.6 mg via INTRAVENOUS
  Filled 2017-03-05 (×2): qty 1

## 2017-03-05 MED ORDER — PROMETHAZINE HCL 25 MG/ML IJ SOLN: 6.2500 mg | INTRAMUSCULAR | Status: DC | PRN

## 2017-03-05 MED ORDER — HYDROMORPHONE HCL 1 MG/ML IJ SOLN
INTRAMUSCULAR | Status: AC
  Filled 2017-03-05: qty 0.5

## 2017-03-05 MED ORDER — ROCURONIUM BROMIDE 100 MG/10ML IV SOLN
INTRAVENOUS | Status: AC
  Filled 2017-03-05: qty 1

## 2017-03-05 MED ORDER — FENTANYL CITRATE (PF) 100 MCG/2ML IJ SOLN
INTRAMUSCULAR | Status: DC | PRN
  Administered 2017-03-05: 50 ug via INTRAVENOUS
  Administered 2017-03-05 (×2): 100 ug via INTRAVENOUS
  Administered 2017-03-05: 150 ug via INTRAVENOUS

## 2017-03-05 MED ORDER — OXYCODONE HCL 5 MG PO TABS: 5.0000 mg | ORAL_TABLET | Freq: Once | ORAL | Status: DC | PRN

## 2017-03-05 MED ORDER — MENTHOL 3 MG MT LOZG: 1.0000 | LOZENGE | OROMUCOSAL | Status: DC | PRN

## 2017-03-05 MED ORDER — HYDROMORPHONE HCL 1 MG/ML IJ SOLN
0.2500 mg | INTRAMUSCULAR | Status: DC | PRN
  Administered 2017-03-05 (×3): 0.5 mg via INTRAVENOUS

## 2017-03-05 MED ORDER — MEPERIDINE HCL 25 MG/ML IJ SOLN: 6.2500 mg | INTRAMUSCULAR | Status: DC | PRN

## 2017-03-05 MED ORDER — LACTATED RINGERS IV SOLN
INTRAVENOUS | Status: DC
  Administered 2017-03-05 (×4): via INTRAVENOUS

## 2017-03-05 MED ORDER — ROCURONIUM BROMIDE 100 MG/10ML IV SOLN
INTRAVENOUS | Status: DC | PRN
Start: 2017-03-05 — End: 2017-03-05
  Administered 2017-03-05: 50 mg via INTRAVENOUS

## 2017-03-05 MED ORDER — OXYCODONE HCL 5 MG/5ML PO SOLN: 5.0000 mg | Freq: Once | ORAL | Status: DC | PRN

## 2017-03-05 MED ORDER — HYDROMORPHONE HCL 1 MG/ML IJ SOLN
INTRAMUSCULAR | Status: AC
  Administered 2017-03-05: 0.5 mg via INTRAVENOUS
  Filled 2017-03-05: qty 0.5

## 2017-03-05 MED ORDER — DEXAMETHASONE SODIUM PHOSPHATE 10 MG/ML IJ SOLN
INTRAMUSCULAR | Status: DC | PRN
  Administered 2017-03-05: 10 mg via INTRAVENOUS

## 2017-03-05 MED ORDER — ONDANSETRON HCL 4 MG/2ML IJ SOLN
INTRAMUSCULAR | Status: DC | PRN
  Administered 2017-03-05: 4 mg via INTRAVENOUS

## 2017-03-05 MED ORDER — ONDANSETRON HCL 4 MG PO TABS: 4.0000 mg | ORAL_TABLET | Freq: Four times a day (QID) | ORAL | Status: DC | PRN

## 2017-03-05 MED ORDER — OXYCODONE-ACETAMINOPHEN 5-325 MG PO TABS
1.0000 | ORAL_TABLET | ORAL | Status: DC | PRN
  Administered 2017-03-05 – 2017-03-06 (×4): 2 via ORAL
  Filled 2017-03-05 (×4): qty 2

## 2017-03-05 MED ORDER — SUGAMMADEX SODIUM 200 MG/2ML IV SOLN
INTRAVENOUS | Status: DC | PRN
  Administered 2017-03-05: 172.4 mg via INTRAVENOUS

## 2017-03-05 MED ORDER — LACTATED RINGERS IV SOLN
INTRAVENOUS | Status: DC
  Administered 2017-03-05 – 2017-03-06 (×2): via INTRAVENOUS

## 2017-03-05 MED ORDER — ONDANSETRON HCL 4 MG/2ML IJ SOLN
INTRAMUSCULAR | Status: AC
  Filled 2017-03-05: qty 2

## 2017-03-05 MED ORDER — ONDANSETRON HCL 4 MG/2ML IJ SOLN: 4.0000 mg | Freq: Four times a day (QID) | INTRAMUSCULAR | Status: DC | PRN

## 2017-03-05 MED ORDER — LIDOCAINE HCL (CARDIAC) 20 MG/ML IV SOLN
INTRAVENOUS | Status: DC | PRN
  Administered 2017-03-05: 100 mg via INTRAVENOUS

## 2017-03-05 MED ORDER — MIDAZOLAM HCL 2 MG/2ML IJ SOLN
INTRAMUSCULAR | Status: DC | PRN
  Administered 2017-03-05: 2 mg via INTRAVENOUS

## 2017-03-05 SURGICAL SUPPLY — 35 items
APL SKNCLS STERI-STRIP NONHPOA (GAUZE/BANDAGES/DRESSINGS) ×1
BARRIER ADHS 3X4 INTERCEED (GAUZE/BANDAGES/DRESSINGS) ×1 IMPLANT
BENZOIN TINCTURE PRP APPL 2/3 (GAUZE/BANDAGES/DRESSINGS) ×2 IMPLANT
BRR ADH 4X3 ABS CNTRL BYND (GAUZE/BANDAGES/DRESSINGS) ×1
CANISTER SUCT 3000ML PPV (MISCELLANEOUS) ×2 IMPLANT
CLOTH BEACON ORANGE TIMEOUT ST (SAFETY) ×2 IMPLANT
CONT PATH 16OZ SNAP LID 3702 (MISCELLANEOUS) ×2 IMPLANT
DRAPE CESAREAN BIRTH W POUCH (DRAPES) ×2 IMPLANT
DRSG OPSITE POSTOP 4X10 (GAUZE/BANDAGES/DRESSINGS) ×2 IMPLANT
DURAPREP 26ML APPLICATOR (WOUND CARE) ×4 IMPLANT
ELECT BLADE 6.5 EXT (BLADE) ×2 IMPLANT
GLOVE BIO SURGEON STRL SZ 6 (GLOVE) ×2 IMPLANT
GLOVE BIOGEL PI IND STRL 6 (GLOVE) ×2 IMPLANT
GLOVE BIOGEL PI IND STRL 7.0 (GLOVE) ×1 IMPLANT
GLOVE BIOGEL PI INDICATOR 6 (GLOVE) ×2
GLOVE BIOGEL PI INDICATOR 7.0 (GLOVE) ×1
GOWN STRL REUS W/TWL LRG LVL3 (GOWN DISPOSABLE) ×6 IMPLANT
HEMOSTAT ARISTA ABSORB 3G PWDR (MISCELLANEOUS) ×1 IMPLANT
NS IRRIG 1000ML POUR BTL (IV SOLUTION) ×2 IMPLANT
PACK ABDOMINAL GYN (CUSTOM PROCEDURE TRAY) ×2 IMPLANT
PAD OB MATERNITY 4.3X12.25 (PERSONAL CARE ITEMS) ×2 IMPLANT
PROTECTOR NERVE ULNAR (MISCELLANEOUS) ×2 IMPLANT
SPONGE LAP 18X18 X RAY DECT (DISPOSABLE) ×4 IMPLANT
STRIP CLOSURE SKIN 1/2X4 (GAUZE/BANDAGES/DRESSINGS) ×2 IMPLANT
SUT PDS AB 0 CT1 27 (SUTURE) IMPLANT
SUT PDS AB 3-0 SH 27 (SUTURE) ×2 IMPLANT
SUT PLAIN 2 0 XLH (SUTURE) IMPLANT
SUT VIC AB 0 CT1 18XCR BRD8 (SUTURE) ×2 IMPLANT
SUT VIC AB 0 CT1 36 (SUTURE) ×8 IMPLANT
SUT VIC AB 0 CT1 8-18 (SUTURE) ×4
SUT VIC AB 2-0 CT1 (SUTURE) ×1 IMPLANT
SUT VIC AB 4-0 KS 27 (SUTURE) ×2 IMPLANT
SUT VICRYL 0 TIES 12 18 (SUTURE) ×1 IMPLANT
TOWEL OR 17X24 6PK STRL BLUE (TOWEL DISPOSABLE) ×4 IMPLANT
TRAY FOLEY CATH SILVER 14FR (SET/KITS/TRAYS/PACK) ×2 IMPLANT

## 2017-03-05 NOTE — Op Note (Signed)
Donna Bradshaw PROCEDURE DATE: 03/05/2017  PREOPERATIVE DIAGNOSIS:  Symptomatic fibroids POSTOPERATIVE DIAGNOSIS:  Symptomatic fibroids SURGEON:  Dr. Linda Hedges ASSISTANT:  Dr. Louretta Shorten OPERATION:  Abdominal myomectomy ANESTHESIA:  General endotracheal.  INDICATIONS: The patient is a 31 y.o.G0 with history of symptomatic uterine fibroids.  The patient made a decision to undergo conservative surgical treatment. On the preoperative visit, the risks, benefits, indications, and alternatives of the procedure were reviewed with the patient.  On the day of surgery, the risks of surgery were again discussed with the patient including but not limited to: bleeding which may require transfusion or reoperation; infection which may require antibiotics; injury to bowel, bladder, ureters or other surrounding organs; need for additional procedures; thromboembolic phenomenon, incisional problems and other postoperative/anesthesia complications. Written informed consent was obtained.    OPERATIVE FINDINGS: An 18 week size fibroid uterus with normal tubes and ovaries bilaterally.  ESTIMATED BLOOD LOSS: 850 ml SPECIMENS:  fibroids sent to pathology COMPLICATIONS:  None immediate.   DESCRIPTION OF PROCEDURE: The patient received intravenous antibiotics and had sequential compression devices applied to her lower extremities while in the preoperative area.   She was taken to the operating room and placed under general anesthesia without difficulty and found to be adequate.The abdomen and perineum were prepped and draped in a sterile manner, and a Foley catheter was inserted into the bladder and attached to constant drainage. After an adequate timeout was performed, a Pfannensteil skin incision was made. This incision was taken down to the fascia using electrocautery with care given to maintain good hemostasis. The fascia was incised in the midline and the fascial incision was then extended bilaterally using  electrocautery without difficulty. The fascia was then dissected off the underlying rectus muscles using blunt and sharp dissection. The rectus muscles were split bluntly in the midline and the peritoneum entered sharply without complication. This peritoneal incision was then extended superiorly and inferiorly with care given to prevent bowel or bladder injury. Upon entry into the abdominal cavity, the upper abdomen was inspected and found to be normal. Attention was then turned to the pelvis. A retractor was placed into the incision, and the bowel was packed away with moist laparotomy sponges. The uterus at this point was noted to be mobilized.  The largest fibroid was posterior right intramural.  Bovie cautery was used to incise the serosa and the fibroid wad dissected out sharply.  The fibroid was removed and the endometrial cavity was entered.  The fibroid bed was closed using 0 vicryl figure of eight stitches in deep endometrial, mid-, and superficial layers.  2-0 vicryl was using a running locked fashion to close serosal layer to hemostasis.  Attention was then turned to removal of three subserosal fibroids which were simply removed with Bovie cautery.  Serosa was closed with 0 vicryl in figure of eight stitches.  There was an anterior left intramural fibroid which was removed after Bovie cautery incision in serosa.  Fibroid was dissected out sharply and with Bovie.  Fibroid bed was closed again in three layers using 0 Vicryl with serosa closed in running locked stitch to hemostasis.  Bladder flap was created sharply to access small subserosal fibroid in lower uterine segment.  This fibroid was removed with Bovie cautery and closed with 0 vicryl in figure of eight stitches. The pelvis was irrigated and hemostasis was reconfirmed at all serosa was hemostatic.  Arista was applied to encourage continued hemostasis.  Intercede was placed on posterior uterine incision.  All laparotomy  sponges and instruments were  removed from the abdomen. The peritoneum was closed with 2-0 Vicryl, and the fascia was closed with 0 Vicryl in a running fashion. The skin was closed with a 4-0 Vicryl subcuticular stitch. Sponge, lap, needle, and instrument counts were correct times two. The patient was taken to the recovery area awake, extubated and in stable condition.

## 2017-03-05 NOTE — Addendum Note (Signed)
Addendum  created 03/05/17 1437 by Jonna Munro, CRNA   Sign clinical note

## 2017-03-05 NOTE — Progress Notes (Signed)
NOS  No c/o.  Tolerating clears.  No N/V.  Pain moderate.  No CP/SOB.  VSS. AF. UOP clear and adequate  Gen: A&O x 3 Abd: soft, ND, dressing c/d Ext: no c/c/e  POD0 s/p abd myomectomy -Add toradol -Adv diet -Ambulate -AM labs pending -D/C IVF and foley in AM  Linda Hedges, DO

## 2017-03-05 NOTE — Progress Notes (Signed)
No change to H&P.  [name], DO 

## 2017-03-05 NOTE — Transfer of Care (Signed)
Immediate Anesthesia Transfer of Care Note  Patient: Donna Bradshaw  Procedure(s) Performed: MYOMECTOMY, ABDOMINAL (N/A Abdomen)  Patient Location: PACU  Anesthesia Type:General  Level of Consciousness: awake, alert  and oriented  Airway & Oxygen Therapy: Patient Spontanous Breathing and Patient connected to nasal cannula oxygen  Post-op Assessment: Report given to RN, Post -op Vital signs reviewed and stable and Patient moving all extremities  Post vital signs: Reviewed and stable  Last Vitals:  Vitals:   03/05/17 0614  BP: (!) 152/103  Pulse: 95  Resp: 16  Temp: 37.4 C  SpO2: 100%    Last Pain:  Vitals:   03/05/17 0614  TempSrc: Oral      Patients Stated Pain Goal: 4 (14/43/15 4008)  Complications: No apparent anesthesia complications

## 2017-03-05 NOTE — Anesthesia Postprocedure Evaluation (Signed)
Anesthesia Post Note  Patient: Donna Bradshaw  Procedure(s) Performed: MYOMECTOMY, ABDOMINAL (N/A Abdomen)     Patient location during evaluation: [facility] Anesthesia Type: General Level of consciousness: awake and alert, oriented and patient cooperative Pain management: pain level controlled Vital Signs Assessment: post-procedure vital signs reviewed and stable Respiratory status: nonlabored ventilation, spontaneous breathing, respiratory function stable and patient connected to nasal cannula oxygen Cardiovascular status: stable Postop Assessment: no apparent nausea or vomiting and adequate PO intake Anesthetic complications: no    Last Vitals:  Vitals:   03/05/17 1030 03/05/17 1130  BP: 130/77 120/75  Pulse: 87 75  Resp: 16 16  Temp: 36.9 C 36.8 C  SpO2: 98% 99%    Last Pain:  Vitals:   03/05/17 1415  TempSrc:   PainSc: 8    Pain Goal: Patients Stated Pain Goal: 4 (03/05/17 8341)               Willa Rough

## 2017-03-05 NOTE — Anesthesia Procedure Notes (Signed)
Procedure Name: Intubation Date/Time: 03/05/2017 7:26 AM Performed by: Hewitt Blade Pre-anesthesia Checklist: Patient identified, Emergency Drugs available, Suction available and Patient being monitored Patient Re-evaluated:Patient Re-evaluated prior to induction Oxygen Delivery Method: Circle system utilized Preoxygenation: Pre-oxygenation with 100% oxygen Induction Type: IV induction Ventilation: Mask ventilation without difficulty Laryngoscope Size: Mac and 3 Grade View: Grade I Tube type: Oral Tube size: 7.0 mm Number of attempts: 1 Airway Equipment and Method: Stylet Placement Confirmation: ETT inserted through vocal cords under direct vision,  positive ETCO2 and breath sounds checked- equal and bilateral Secured at: 21 cm Tube secured with: Tape Dental Injury: Teeth and Oropharynx as per pre-operative assessment

## 2017-03-05 NOTE — Anesthesia Preprocedure Evaluation (Signed)
Anesthesia Evaluation  Patient identified by MRN, date of birth, ID band Patient awake    Reviewed: Allergy & Precautions, NPO status , Patient's Chart, lab work & pertinent test results  History of Anesthesia Complications (+) DIFFICULT IV STICK / SPECIAL LINE  Airway Mallampati: II  TM Distance: >3 FB Neck ROM: Full    Dental no notable dental hx.    Pulmonary neg pulmonary ROS,    Pulmonary exam normal breath sounds clear to auscultation       Cardiovascular negative cardio ROS Normal cardiovascular exam Rhythm:Regular Rate:Normal     Neuro/Psych  Headaches, negative neurological ROS  negative psych ROS   GI/Hepatic negative GI ROS, Neg liver ROS,   Endo/Other  negative endocrine ROS  Renal/GU negative Renal ROS  negative genitourinary   Musculoskeletal negative musculoskeletal ROS (+)   Abdominal   Peds negative pediatric ROS (+)  Hematology negative hematology ROS (+)   Anesthesia Other Findings   Reproductive/Obstetrics negative OB ROS                             Anesthesia Physical Anesthesia Plan  ASA: II  Anesthesia Plan: General   Post-op Pain Management:    Induction: Intravenous  PONV Risk Score and Plan: 3 and Ondansetron, Dexamethasone and Midazolam  Airway Management Planned: Oral ETT  Additional Equipment:   Intra-op Plan:   Post-operative Plan: Extubation in OR  Informed Consent: I have reviewed the patients History and Physical, chart, labs and discussed the procedure including the risks, benefits and alternatives for the proposed anesthesia with the patient or authorized representative who has indicated his/her understanding and acceptance.   Dental advisory given  Plan Discussed with: CRNA  Anesthesia Plan Comments:         Anesthesia Quick Evaluation

## 2017-03-05 NOTE — Anesthesia Postprocedure Evaluation (Signed)
Anesthesia Post Note  Patient: Aleya Durnell  Procedure(s) Performed: MYOMECTOMY, ABDOMINAL (N/A Abdomen)     Patient location during evaluation: PACU Anesthesia Type: General Level of consciousness: awake and alert Pain management: pain level controlled Vital Signs Assessment: post-procedure vital signs reviewed and stable Respiratory status: spontaneous breathing, nonlabored ventilation, respiratory function stable and patient connected to nasal cannula oxygen Cardiovascular status: blood pressure returned to baseline and stable Postop Assessment: no apparent nausea or vomiting Anesthetic complications: no    Last Vitals:  Vitals:   03/05/17 1000 03/05/17 1015  BP: 127/83 121/86  Pulse: 77 88  Resp: 14 20  Temp:    SpO2: 100% 100%    Last Pain:  Vitals:   03/05/17 1000  TempSrc:   PainSc: 4    Pain Goal: Patients Stated Pain Goal: 4 (03/05/17 9371)               Lynda Rainwater

## 2017-03-06 ENCOUNTER — Encounter (HOSPITAL_COMMUNITY): Payer: Self-pay | Admitting: Obstetrics & Gynecology

## 2017-03-06 DIAGNOSIS — D251 Intramural leiomyoma of uterus: Secondary | ICD-10-CM | POA: Diagnosis not present

## 2017-03-06 LAB — CBC
HCT: 22.7 % — ABNORMAL LOW (ref 36.0–46.0)
HCT: 22.2 % — ABNORMAL LOW (ref 36.0–46.0)
Hemoglobin: 8 g/dL — ABNORMAL LOW (ref 12.0–15.0)
Hemoglobin: 7.6 g/dL — ABNORMAL LOW (ref 12.0–15.0)
MCH: 31.1 pg (ref 26.0–34.0)
MCH: 30.4 pg (ref 26.0–34.0)
MCHC: 35.2 g/dL (ref 30.0–36.0)
MCHC: 34.2 g/dL (ref 30.0–36.0)
MCV: 88.3 fL (ref 78.0–100.0)
MCV: 88.8 fL (ref 78.0–100.0)
Platelets: 222 10*3/uL (ref 150–400)
Platelets: 236 10*3/uL (ref 150–400)
RBC: 2.57 MIL/uL — ABNORMAL LOW (ref 3.87–5.11)
RBC: 2.5 MIL/uL — ABNORMAL LOW (ref 3.87–5.11)
RDW: 13.1 % (ref 11.5–15.5)
RDW: 13.1 % (ref 11.5–15.5)
WBC: 10.3 10*3/uL (ref 4.0–10.5)
WBC: 12.2 10*3/uL — ABNORMAL HIGH (ref 4.0–10.5)

## 2017-03-06 MED ORDER — DOCUSATE SODIUM 100 MG PO CAPS: 100.0000 mg | ORAL_CAPSULE | Freq: Two times a day (BID) | ORAL | 0 refills | Status: DC

## 2017-03-06 MED ORDER — OXYCODONE-ACETAMINOPHEN 5-325 MG PO TABS: 1.0000 | ORAL_TABLET | ORAL | 0 refills | Status: DC | PRN

## 2017-03-06 MED ORDER — SIMETHICONE 80 MG PO CHEW: 80.0000 mg | CHEWABLE_TABLET | Freq: Four times a day (QID) | ORAL | Status: DC | PRN

## 2017-03-06 MED ORDER — IBUPROFEN 800 MG PO TABS: 800.0000 mg | ORAL_TABLET | Freq: Four times a day (QID) | ORAL | 1 refills | Status: DC | PRN

## 2017-03-06 NOTE — Progress Notes (Signed)
No current c/o except gas pain.  Incisional pain well controlled with po meds.  No N/V.  Tolerating po.  Ambulating well.  No void yet.  VSS. AF.  Adequate UOP overnight. Hgb 13.7->8  Gen: A&O x 3 Abd: soft, ND, dressing c/d Ext: no c/c/e  POD#1 s/p Abdominal myomectomy -Recheck Hgb at 12 -Consider d/c home this pm if voiding and gas pain improved  Linda Hedges, DO

## 2017-03-06 NOTE — Discharge Summary (Signed)
Physician Discharge Summary  Patient ID: Donna Bradshaw MRN: [id number] DOB/AGE: 33-06-1985 33 y.o.  Admit date: 03/05/2017 Discharge date: 03/06/2017  Admission Diagnoses: Symptomatic uterine leiomyomata  Discharge Diagnoses: SAA Active Problems:   S/P myomectomy   Discharged Condition: good  Hospital Course: Patient was admitted for surgical treatment of uterine fibroids.  Abdominal myomectomy was completed without complication.  Patient did extremely well postoperatively and on POD#1 was ready to be discharged home.  She will f/u in office in 1-2 weeks for incision check.  Consults: None  Significant Diagnostic Studies: labs: Hgb 7.6   Treatments: surgery: abdominal myomectomy  Discharge Exam: Blood pressure (!) 111/59, pulse 80, temperature 99.1 F (37.3 C), temperature source Oral, resp. rate 18, height 5\' 3"  (1.6 m), weight 190 lb (86.2 kg), last menstrual period 02/26/2017, SpO2 100 %. General appearance: alert, cooperative and appears stated age Incision/Wound: Abd: soft, ND  Disposition: 01-Home or Self Care   Allergies as of 03/06/2017   No Known Allergies     Medication List    STOP taking these medications   MICROGESTIN FE 1.5/30 1.5-30 MG-MCG tablet Generic drug:  norethindrone-ethinyl estradiol-iron     TAKE these medications   docusate sodium 100 MG capsule Commonly known as:  COLACE Take 1 capsule (100 mg total) by mouth 2 (two) times daily.   ibuprofen 800 MG tablet Commonly known as:  ADVIL Take 1 tablet (800 mg total) by mouth every 6 (six) hours as needed.   oxyCODONE-acetaminophen 5-325 MG tablet Commonly known as:  PERCOCET/ROXICET Take 1-2 tablets by mouth every 4 (four) hours as needed (moderate to severe pain (when tolerating fluids)).   SUMAtriptan 50 MG tablet Commonly known as:  IMITREX Take 50 mg by mouth every 2 (two) hours as needed for migraine. May repeat in 2 hours if headache persists or recurs.        SignedLinda Hedges 03/06/2017, 1:33 PM

## 2017-03-06 NOTE — Progress Notes (Signed)
Discharge teaching complete with pt. Pt understood all information and did not have any questions. Pt discharged home to family. 

## 2017-03-06 NOTE — Discharge Instructions (Signed)
Call MD for T>100.4, heavy vaginal bleeding, severe abdominal pain, intractable nausea and/or vomiting, or respiratory distress.  Call office to schedule postop appointment in 1-2 weeks.  No driving while taking narcotics.  Pelvic rest x 6 weeks.

## 2017-03-06 NOTE — Progress Notes (Signed)
Voiding without difficulty. Hgb 8->7.6 Will d/c home.  Linda Hedges, DO

## 2017-03-11 ENCOUNTER — Encounter (HOSPITAL_COMMUNITY): Payer: Self-pay | Admitting: Emergency Medicine

## 2017-03-11 ENCOUNTER — Emergency Department (HOSPITAL_COMMUNITY)
Admission: EM | Admit: 2017-03-11 | Discharge: 2017-03-11 | Disposition: A | Discharge status: Home / Self Care | Payer: BLUE CROSS/BLUE SHIELD | Attending: Emergency Medicine | Admitting: Emergency Medicine

## 2017-03-11 DIAGNOSIS — E0590 Thyrotoxicosis, unspecified without thyrotoxic crisis or storm: Secondary | ICD-10-CM | POA: Insufficient documentation

## 2017-03-11 DIAGNOSIS — R51 Headache: Secondary | ICD-10-CM | POA: Diagnosis not present

## 2017-03-11 DIAGNOSIS — R519 Headache, unspecified: Secondary | ICD-10-CM

## 2017-03-11 MED ORDER — PROCHLORPERAZINE EDISYLATE 5 MG/ML IJ SOLN
10.0000 mg | Freq: Once | INTRAMUSCULAR | Status: AC
  Administered 2017-03-11: 10 mg via INTRAVENOUS
  Filled 2017-03-11: qty 2

## 2017-03-11 MED ORDER — ONDANSETRON 4 MG PO TBDP
ORAL_TABLET | ORAL | Status: AC
  Filled 2017-03-11: qty 1

## 2017-03-11 MED ORDER — KETOROLAC TROMETHAMINE 15 MG/ML IJ SOLN
15.0000 mg | Freq: Once | INTRAMUSCULAR | Status: AC
  Administered 2017-03-11: 15 mg via INTRAVENOUS
  Filled 2017-03-11: qty 1

## 2017-03-11 MED ORDER — DIPHENHYDRAMINE HCL 50 MG/ML IJ SOLN
25.0000 mg | Freq: Once | INTRAMUSCULAR | Status: AC
  Administered 2017-03-11: 25 mg via INTRAVENOUS
  Filled 2017-03-11: qty 1

## 2017-03-11 MED ORDER — SODIUM CHLORIDE 0.9 % IV BOLUS (SEPSIS)
1000.0000 mL | Freq: Once | INTRAVENOUS | Status: AC
  Administered 2017-03-11: 1000 mL via INTRAVENOUS

## 2017-03-11 MED ORDER — OXYCODONE-ACETAMINOPHEN 5-325 MG PO TABS
ORAL_TABLET | ORAL | Status: AC
  Filled 2017-03-11: qty 1

## 2017-03-11 MED ORDER — OXYCODONE-ACETAMINOPHEN 5-325 MG PO TABS: 1.0000 | ORAL_TABLET | Freq: Once | ORAL | Status: DC

## 2017-03-11 NOTE — ED Notes (Signed)
Pt verbalized understanding of d/c instructions and has no further questions. VSS, NAD. Pt removed all belongings. Pt d/c home with family driving.

## 2017-03-11 NOTE — ED Triage Notes (Signed)
Pt states she had myomectomy on Tuesday that was completed without complication to remove uterine fibroids and since the procedure she has had a constant headache. Opt has taken percocet and her normal migraine  medication with no relief. Headache is 7/10 at this time.

## 2017-03-13 NOTE — ED Provider Notes (Signed)
[name] Provider Note   CSN: [id number] Arrival date & time: 03/11/17  1155     History   Chief Complaint Chief Complaint  Patient presents with  . Headache    HPI Donna Bradshaw is a 31 y.o. female.  HPI  31 year old female with headache. Should a myomectomy on Tuesday and reports that she's had a persistent headache since then. She has otherwise been recovering well with regards to that procedure. She has a past history of what she calls migraines. She has been taking her usual medications without improvement. No fevers or chills. Mild photophobia. No change in visual acuity. No vomiting. No blood thinners. No neck stiffness.  Past Medical History:  Diagnosis Date  . Hyperthyroidism    no longer an issue  . Migraine   . Uterine fibroid     Patient Active Problem List   Diagnosis Date Noted  . S/P myomectomy 03/05/2017  . Migraine without aura 06/14/2015  . Thyrotoxicosis 07/21/2014    Past Surgical History:  Procedure Laterality Date  . MYOMECTOMY N/A 03/05/2017   Procedure: MYOMECTOMY, ABDOMINAL;  Surgeon: Linda Hedges, DO;  Location: [facility];  Service: Gynecology;  Laterality: N/A;  . NO PAST SURGERIES    . WISDOM TOOTH EXTRACTION      OB History    No data available       Home Medications    Prior to Admission medications   Medication Sig Start Date End Date Taking? Authorizing Provider  docusate sodium (COLACE) 100 MG capsule Take 1 capsule (100 mg total) by mouth 2 (two) times daily. 03/06/17   Morris, Jinny Blossom, DO  ibuprofen (ADVIL,MOTRIN) 800 MG tablet Take 1 tablet (800 mg total) by mouth every 6 (six) hours as needed. 03/06/17   Morris, Jinny Blossom, DO  oxyCODONE-acetaminophen (PERCOCET/ROXICET) 5-325 MG tablet Take 1-2 tablets by mouth every 4 (four) hours as needed (moderate to severe pain (when tolerating fluids)). 03/06/17   Morris, Jinny Blossom, DO  SUMAtriptan (IMITREX) 50 MG tablet Take 50 mg by mouth every 2  (two) hours as needed for migraine. May repeat in 2 hours if headache persists or recurs.    [provider]    Family History Family History  Problem Relation Age of Onset  . Diabetes Maternal Uncle   . Diabetes Maternal Grandmother   . Cancer Unknown   . Seizures Unknown   . Breast cancer Paternal Grandmother   . Migraines Neg Hx     Social History Social History  Substance Use Topics  . Smoking status: Never Smoker  . Smokeless tobacco: Never Used  . Alcohol use 0.0 oz/week     Comment: occ     Allergies   Patient has no known allergies.   Review of Systems Review of Systems   Physical Exam Updated Vital Signs BP 116/78 (BP Location: Right Arm)   Pulse 70   Temp 98.9 F (37.2 C) (Oral)   Resp 16   LMP 02/26/2017   SpO2 100%   Physical Exam  Constitutional: She appears well-developed and well-nourished. No distress.  Laying in bed. Lights off. Nontoxic appearance.  HENT:  Head: Normocephalic and atraumatic.  Eyes: Conjunctivae are normal. Right eye exhibits no discharge. Left eye exhibits no discharge.  Neck: Neck supple.  No neck rigidity  Cardiovascular: Normal rate, regular rhythm and normal heart sounds.  Exam reveals no gallop and no friction rub.   No murmur heard. Pulmonary/Chest: Effort normal and breath sounds normal. No respiratory distress.  Abdominal: Soft. She exhibits no distension. There is no tenderness.  Musculoskeletal: She exhibits no edema or tenderness.  Neurological: She is alert.  Speech clear. Content appropriate. Cranial nerves II through XII intact bilaterally. Strength out of 5 bilateral upper lower extremities.  Skin: Skin is warm and dry.  Psychiatric: She has a normal mood and affect. Her behavior is normal. Thought content normal.  Nursing note and vitals reviewed.    ED Treatments / Results  Labs (all labs ordered are listed, but only abnormal results are displayed) Labs Reviewed - No data to display  EKG   EKG Interpretation None       Radiology No results found.  Procedures Procedures (including critical care time)  Medications Ordered in ED Medications  sodium chloride 0.9 % bolus 1,000 mL (0 mLs Intravenous Stopped 03/11/17 2115)  prochlorperazine (COMPAZINE) injection 10 mg (10 mg Intravenous Given 03/11/17 2008)  ketorolac (TORADOL) 15 MG/ML injection 15 mg (15 mg Intravenous Given 03/11/17 2008)  diphenhydrAMINE (BENADRYL) injection 25 mg (25 mg Intravenous Given 03/11/17 2008)  sodium chloride 0.9 % bolus 1,000 mL (0 mLs Intravenous Stopped 03/11/17 2115)     Initial Impression / Assessment and Plan / ED Course  I have reviewed the triage vital signs and the nursing notes.  Pertinent labs & imaging results that were available during my care of the patient were reviewed by me and considered in my medical decision making (see chart for details).     30 year old female with headache. No trauma. Afebrile. No meningismus. Doubt emergent etiology of her headache. She is symptomatically with marked improvement. Return precautions discussed.  Final Clinical Impressions(s) / ED Diagnoses   Final diagnoses:  Acute nonintractable headache, unspecified headache type    New Prescriptions Discharge Medication List as of 03/11/2017  8:53 PM       Virgel Manifold, MD 03/13/17 1218

## 2017-05-08 ENCOUNTER — Ambulatory Visit (INDEPENDENT_AMBULATORY_CARE_PROVIDER_SITE_OTHER): Payer: BLUE CROSS/BLUE SHIELD | Admitting: Neurology

## 2017-05-08 ENCOUNTER — Encounter: Payer: Self-pay | Admitting: Neurology

## 2017-05-08 DIAGNOSIS — I10 Essential (primary) hypertension: Secondary | ICD-10-CM

## 2017-05-08 DIAGNOSIS — G43009 Migraine without aura, not intractable, without status migrainosus: Secondary | ICD-10-CM

## 2017-05-08 MED ORDER — PROPRANOLOL HCL ER 80 MG PO CP24: 80.0000 mg | ORAL_CAPSULE | Freq: Every day | ORAL | 12 refills | Status: DC

## 2017-05-08 MED ORDER — NARATRIPTAN HCL 2.5 MG PO TABS: 2.5000 mg | ORAL_TABLET | ORAL | 12 refills | Status: DC | PRN

## 2017-05-08 NOTE — Progress Notes (Signed)
[facility]    Provider:  Dr Jaynee Eagles  CC:  migraines  Interval history 05/08/2017: Here for follow up. 2 moderate migraines a month last 2-3 days so 6 migraine days a month. At this time using imitrex pill and works but comes back. She is taking 9    Medications tried: Onzetra, imitrex oral and nasal spray and powder, Cambia, Zomig, Maxalt none of these worked at [employer] [facility] migraine  HPI:  Donna Bradshaw is a 31 y.o. female here as a referral from Dr. Drema Dallas for migraines. Migraines since her teenage years. She has throbbing behind the right eyeball and unilateral on the right extending to the neck. He has 5 days of migraines a month. She has nausea, she has vomited in the past. She has to go into a dark room which helps, she is sound sensitive and needs silence to sit still. 7/10 in pain. Unknown triggers except maybe around her period. 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 She takes it then by the end of the day the headache is back. She hates the way it makes her feel, like she is heavy and her chest is heavy. Rizatriptan did not help. No aura. She is having vision changes with the headaches.   Reviewed notes, labs and imaging from outside physicians, which showed: reviewed notes from [facility] physicians. Patient has migraines, h/o hyperthyroidism, menstrual irregularity. Was referred for migraine management. She was evaluated in 06/2014 for thyrotoxicosis, She had a neck pressure sensation x 2 weeks, sh ehad a low TSH and high fT4 with few thyrotoxic sxs: heat intolerance, insomnia, fatigue,which resolved suspected thyroiditis.   Review of Systems: Patient complains of symptoms per HPI as well as the following symptoms: no CP, no SOB. Pertinent negatives per HPI. All others negative.   Social History   Socioeconomic History  . Marital status: Single    Spouse name: Not on file  . Number of children: 0  . Years of education: 12+  . Highest  education level: Not on file  Social Needs  . Financial resource strain: Not on file  . Food insecurity - worry: Not on file  . Food insecurity - inability: Not on file  . Transportation needs - medical: Not on file  . Transportation needs - non-medical: Not on file  Occupational History  . Occupation: Occupational psychologist  Tobacco Use  . Smoking status: Never Smoker  . Smokeless tobacco: Never Used  Substance and Sexual Activity  . Alcohol use: Yes    Alcohol/week: 0.0 oz    Comment: occ  . Drug use: No  . Sexual activity: Not on file  Other Topics Concern  . Not on file  Social History Narrative   Lives w/ sister.   Caffeine use: 1-2 times per week   Right handed    Family History  Problem Relation Age of Onset  . Diabetes Maternal Uncle   . Diabetes Maternal Grandmother   . Cancer Unknown   . Seizures Unknown   . Breast cancer Paternal Grandmother   . Migraines Neg Hx     Past Medical History:  Diagnosis Date  . Hyperthyroidism    no longer an issue  . Migraine   . Uterine fibroid     Past Surgical History:  Procedure Laterality Date  . MYOMECTOMY N/A 03/05/2017   Procedure: MYOMECTOMY, ABDOMINAL;  Surgeon: Linda Hedges, DO;  Location: [facility];  Service: Gynecology;  Laterality: N/A;  . NO PAST  SURGERIES    . WISDOM TOOTH EXTRACTION      Current Outpatient Medications  Medication Sig Dispense Refill  . ibuprofen (ADVIL,MOTRIN) 800 MG tablet Take 1 tablet (800 mg total) by mouth every 6 (six) hours as needed. 30 tablet 1  . Norethin Ace-Eth Estrad-FE (MICROGESTIN FE 1.5/30 PO) Take 1 tablet by mouth daily.    . SUMAtriptan (IMITREX) 50 MG tablet Take 50 mg by mouth every 2 (two) hours as needed for migraine. May repeat in 2 hours if headache persists or recurs.    . naratriptan (AMERGE) 2.5 MG tablet Take 1 tablet (2.5 mg total) by mouth as needed for migraine. Take one (1) tablet at onset of headache; may repeat once in 2 hours 10 tablet 12  . propranolol ER  (INDERAL LA) 80 MG 24 hr capsule Take 1 capsule (80 mg total) by mouth daily. 30 capsule 12   No current facility-administered medications for this visit.     Allergies as of 05/08/2017  . (No Known Allergies)    Vitals: BP (!) 140/97 (BP Location: Left Arm, Patient Position: Sitting) Comment: pt states that's about how it usually runs  Pulse 90   Ht 5\' 4"  (1.626 m)   Wt 192 lb (87.1 kg)   BMI 32.96 kg/m  Last Weight:  Wt Readings from Last 1 Encounters:  05/08/17 192 lb (87.1 kg)   Last Height:   Ht Readings from Last 1 Encounters:  05/08/17 5\' 4"  (1.626 m)    Physical exam: Exam: Gen: NAD, conversant, well nourised, obese, well groomed                     CV: RRR, no MRG. No Carotid Bruits. No peripheral edema, warm, nontender Eyes: Conjunctivae clear without exudates or hemorrhage  Neuro: Detailed Neurologic Exam  Speech:    Speech is normal; fluent and spontaneous with normal comprehension.  Cognition:    The patient is oriented to person, place, and time;     recent and remote memory intact;     language fluent;     normal attention, concentration,     fund of knowledge Cranial Nerves:    The pupils are equal, round, and reactive to light. The fundi are normal and spontaneous venous pulsations are present. Visual fields are full to finger confrontation. Extraocular movements are intact. Trigeminal sensation is intact and the muscles of mastication are normal. The face is symmetric. The palate elevates in the midline. Hearing intact. Voice is normal. Shoulder shrug is normal. The tongue has normal motion without fasciculations.   Coordination:    Normal finger to nose and heel to shin. Normal rapid alternating movements.   Gait:    Heel-toe and tandem gait are normal.   Motor Observation:    No asymmetry, no atrophy, and no involuntary movements noted. Tone:    Normal muscle tone.    Posture:    Posture is normal. normal erect    Strength:    Strength  is V/V in the upper and lower limbs.      Sensation: intact to LT     Reflex Exam:  DTR's:    Deep tendon reflexes in the upper and lower extremities are normal bilaterally.   Toes:    The toes are downgoing bilaterally.   Clonus:    Clonus is absent.      Assessment/Plan:  31 year old with migraines here for follow up  Amerge(Naratriptan): Please take one tablet at the  onset of your headache. If it does not improve the symptoms please take one additional tablet. Do not take more then 2 tablets in 24hrs. Do not take use more then 2 to 3 times in a week.  May take with 1000mg  tylenol or 800mg  ibuprofen or 550mg  naproxen  Propranolol 80mg  daily (email if increase needed)  Medications tried: Onzetra, imitrex oral and nasal spray and powder, Cambia, Zomig, Maxalt none of these worked at [employer] [facility] migraine  Discussed: To prevent or relieve headaches, try the following: Cool Compress. Lie down and place a cool compress on your head.  Avoid headache triggers. If certain foods or odors seem to have triggered your migraines in the past, avoid them. A headache diary might help you identify triggers.  Include physical activity in your daily routine. Try a daily walk or other moderate aerobic exercise.  Manage stress. Find healthy ways to cope with the stressors, such as delegating tasks on your to-do list.  Practice relaxation techniques. Try deep breathing, yoga, massage and visualization.  Eat regularly. Eating regularly scheduled meals and maintaining a healthy diet might help prevent headaches. Also, drink plenty of fluids.  Follow a regular sleep schedule. Sleep deprivation might contribute to headaches Consider biofeedback. With this mind-body technique, you learn to control certain bodily functions - such as muscle tension, heart rate and blood pressure - to prevent headaches or reduce headache pain.    Proceed to emergency room if you experience new or worsening symptoms or symptoms  do not resolve, if you have new neurologic symptoms or if headache is severe, or for any concerning symptom.   Provided education and documentation from American headache Society toolbox including articles on: chronic migraine medication overuse headache, chronic migraines, prevention of migraines, behavioral and other nonpharmacologic treatments for headache.   Sarina Ill, MD  [facility] [address] [place],  [address]  Phone [phone] Fax [phone]  A total of 25 minutes was spent face-to-face with this patient. Over half this time was spent on counseling patient on the migraine diagnosis and different diagnostic and therapeutic options available.

## 2017-05-08 NOTE — Patient Instructions (Addendum)
Amerge(Naratriptan): Please take one tablet at the onset of your headache. If it does not improve the symptoms please take one additional tablet. Do not take more then 2 tablets in 24hrs. Do not take use more then 2 to 3 times in a week.  May take with 1000mg  tylenol or 800mg  ibuprofen or 550mg  naproxen  Propranolol 80mg  daily (email if increase needed)  Propranolol extended-release capsules What is this medicine? PROPRANOLOL (proe PRAN oh lole) is a beta-blocker. Beta-blockers reduce the workload on the heart and help it to beat more regularly. This medicine is used to treat high blood pressure, heart muscle disease, and prevent chest pain caused by angina. It is also used to prevent migraine headaches. You should not use this medicine to treat a migraine that has already started. This medicine may be used for other purposes; ask your health care provider or pharmacist if you have questions. COMMON BRAND NAME(S): Inderal LA, Inderal XL, InnoPran XL What should I tell my health care provider before I take this medicine? They need to know if you have any of these conditions: -circulation problems, or blood vessel disease -diabetes -history of heart attack or heart disease, vasospastic angina -kidney disease -liver disease -lung or breathing disease, like asthma or emphysema -pheochromocytoma -slow heart rate -thyroid disease -an unusual or allergic reaction to propranolol, other beta-blockers, medicines, foods, dyes, or preservatives -pregnant or trying to get pregnant -breast-feeding How should I use this medicine? Take this medicine by mouth with a glass of water. Follow the directions on the prescription label. Do not crush or chew. Take your doses at regular intervals. Do not take your medicine more often than directed. Do not stop taking except on the advice of your doctor or health care professional. Talk to your pediatrician regarding the use of this medicine in children. Special  care may be needed. Overdosage: If you think you have taken too much of this medicine contact a poison control center or emergency room at once. NOTE: This medicine is only for you. Do not share this medicine with others. What if I miss a dose? If you miss a dose, take it as soon as you can. If it is almost time for your next dose, take only that dose. Do not take double or extra doses. What may interact with this medicine? Do not take this medicine with any of the following medications: -feverfew -phenothiazines like chlorpromazine, mesoridazine, prochlorperazine, thioridazine This medicine may also interact with the following medications: -aluminum hydroxide gel -antipyrine -antiviral medicines for HIV or AIDS -barbiturates like phenobarbital -certain medicines for blood pressure, heart disease, irregular heart beat -cimetidine -ciprofloxacin -diazepam -fluconazole -haloperidol -isoniazid -medicines for cholesterol like cholestyramine or colestipol -medicines for mental depression -medicines for migraine headache like almotriptan, eletriptan, frovatriptan, naratriptan, rizatriptan, sumatriptan, zolmitriptan -NSAIDs, medicines for pain and inflammation, like ibuprofen or naproxen -phenytoin -rifampin -teniposide -theophylline -thyroid medicines -tolbutamide -warfarin -zileuton This list may not describe all possible interactions. Give your health care provider a list of all the medicines, herbs, non-prescription drugs, or dietary supplements you use. Also tell them if you smoke, drink alcohol, or use illegal drugs. Some items may interact with your medicine. What should I watch for while using this medicine? Visit your doctor or health care professional for regular check ups. Contact your doctor right away if your symptoms worsen. Check your blood pressure and pulse rate regularly. Ask your health care professional what your blood pressure and pulse rate should be, and when you  should contact  them. Do not stop taking this medicine suddenly. This could lead to serious heart-related effects. You may get drowsy or dizzy. Do not drive, use machinery, or do anything that needs mental alertness until you know how this drug affects you. Do not stand or sit up quickly, especially if you are an older patient. This reduces the risk of dizzy or fainting spells. Alcohol can make you more drowsy and dizzy. Avoid alcoholic drinks. This medicine can affect blood sugar levels. If you have diabetes, check with your doctor or health care professional before you change your diet or the dose of your diabetic medicine. Do not treat yourself for coughs, colds, or pain while you are taking this medicine without asking your doctor or health care professional for advice. Some ingredients may increase your blood pressure. What side effects may I notice from receiving this medicine? Side effects that you should report to your doctor or health care professional as soon as possible: -allergic reactions like skin rash, itching or hives, swelling of the face, lips, or tongue -breathing problems -changes in blood sugar -cold hands or feet -difficulty sleeping, nightmares -dry peeling skin -hallucinations -muscle cramps or weakness -slow heart rate -swelling of the legs and ankles -vomiting Side effects that usually do not require medical attention (report to your doctor or health care professional if they continue or are bothersome): -change in sex drive or performance -diarrhea -dry sore eyes -hair loss -nausea -weak or tired This list may not describe all possible side effects. Call your doctor for medical advice about side effects. You may report side effects to FDA at 1-800-FDA-1088. Where should I keep my medicine? Keep out of the reach of children. Store at room temperature between 15 and 30 degrees C (59 and 86 degrees F). Protect from light, moisture and freezing. Keep container  tightly closed. Throw away any unused medicine after the expiration date. NOTE: This sheet is a summary. It may not cover all possible information. If you have questions about this medicine, talk to your doctor, pharmacist, or health care provider.  2018 Elsevier/Gold Standard (2013-01-16 14:58:56)   Naratriptan tablets What is this medicine? NARATRIPTAN (NAR a trip tan) is used to treat migraines with or without aura. An aura is a strange feeling or visual disturbance that warns you of an attack. It is not used to prevent migraines. This medicine may be used for other purposes; ask your health care provider or pharmacist if you have questions. COMMON BRAND NAME(S): Amerge What should I tell my health care provider before I take this medicine? They need to know if you have any of these conditions: -bowel disease or colitis -diabetes -family history of heart disease -fast or irregular heart beat -heart or blood vessel disease, angina (chest pain), or previous heart attack -high blood pressure -high cholesterol -history of stroke, transient ischemic attacks (TIAs or mini-strokes), or intracranial bleeding -kidney or liver disease -overweight -poor circulation -postmenopausal or surgical removal of uterus and ovaries -Raynaud's disease -seizure disorder -an unusual or allergic reaction to naratriptan, other medicines, foods, dyes, or preservatives -pregnant or trying to get pregnant -breast-feeding How should I use this medicine? Take this medicine by mouth with a glass of water. Follow the directions on the prescription label. This medicine is taken at the first symptoms of a migraine. It is not for everyday use. If your migraine headache returns after one dose, you can take another dose as directed. You must leave at least 4 hours between doses, and do  not take more than 5 mg in 24 hours. If there is no improvement at all after the first dose, do not take a second dose without talking  to your doctor or health care professional. Do not take your medicine more often than directed. Talk to your pediatrician regarding the use of this medicine in children. Special care may be needed. Overdosage: If you think you have taken too much of this medicine contact a poison control center or emergency room at once. NOTE: This medicine is only for you. Do not share this medicine with others. What if I miss a dose? This does not apply; this medicine is not for regular use. What may interact with this medicine? Do not take this medicine with any of the following medicines: -amphetamine, dextroamphetamine or cocaine -dihydroergotamine, ergotamine, ergoloid mesylates, methysergide, or ergot-type medication - do not take within 24 hours of taking naratriptan -feverfew -MAOIs like Carbex, Eldepryl, Marplan, Nardil, and Parnate - do not take naratriptan within 2 weeks of stopping MAOI therapy -other migraine medicines like almotriptan, eletriptan, sumatriptan, rizatriptan, zolmitriptan - do not take within 24 hours of taking naratriptan -tryptophan This medicine may also interact with the following medications: -birth control pills -medicines for mental depression, anxiety or mood problems This list may not describe all possible interactions. Give your health care provider a list of all the medicines, herbs, non-prescription drugs, or dietary supplements you use. Also tell them if you smoke, drink alcohol, or use illegal drugs. Some items may interact with your medicine. What should I watch for while using this medicine? Only take this medicine for a migraine headache. Take it if you get warning symptoms or at the start of a migraine attack. It is not for regular use to prevent migraine attacks. You may get drowsy or dizzy. Do not drive, use machinery, or do anything that needs mental alertness until you know how this medicine affects you. To reduce dizzy or fainting spells, do not sit or stand up  quickly, especially if you are an older patient. Alcohol can increase drowsiness, dizziness and flushing. Avoid alcoholic drinks. Smoking cigarettes may increase the risk of heart-related side effects from using this medicine. If you take migraine medicines for 10 or more days a month, your migraines may get worse. Keep a diary of headache days and medicine use. Contact your healthcare professional if your migraine attacks occur more frequently. What side effects may I notice from receiving this medicine? Side effects that you should report to your doctor or health care professional as soon as possible: -allergic reactions like skin rash, itching or hives, swelling of the face, lips, or tongue -fast, slow, or irregular heart beat -hallucinations -increased or decreased blood pressure -seizures -severe stomach pain and cramping, bloody diarrhea -signs and symptoms of a blood clot such as breathing problems; changes in vision; chest pain; severe, sudden headache; pain, swelling, warmth in the leg; trouble speaking; sudden numbness or weakness of the face, arm or leg -tingling, pain, or numbness in the face, hands, or feet Side effects that usually do not require medical attention (report to your doctor or health care professional if they continue or are bothersome): -feeling warm, flushing, or redness of the face -headache -muscle cramps, pain -nausea, vomiting -unusually weak or tired This list may not describe all possible side effects. Call your doctor for medical advice about side effects. You may report side effects to FDA at 1-800-FDA-1088. Where should I keep my medicine? Keep out of the reach of  children. Store at room temperature between 20 and 25 degrees C (68 and 77 degrees F). Throw away any unused medicine after the expiration date. NOTE: This sheet is a summary. It may not cover all possible information. If you have questions about this medicine, talk to your doctor, pharmacist, or  health care provider.  2018 Elsevier/Gold Standard (2013-01-13 10:15:15)

## 2017-11-18 ENCOUNTER — Encounter: Payer: Self-pay | Admitting: Neurology

## 2017-11-18 ENCOUNTER — Ambulatory Visit (INDEPENDENT_AMBULATORY_CARE_PROVIDER_SITE_OTHER): Payer: BLUE CROSS/BLUE SHIELD | Admitting: Neurology

## 2017-11-18 VITALS — BP 143/101 | HR 66 | Ht 64.0 in | Wt 194.0 lb

## 2017-11-18 DIAGNOSIS — I10 Essential (primary) hypertension: Secondary | ICD-10-CM | POA: Diagnosis not present

## 2017-11-18 DIAGNOSIS — G43009 Migraine without aura, not intractable, without status migrainosus: Secondary | ICD-10-CM | POA: Diagnosis not present

## 2017-11-18 MED ORDER — PROPRANOLOL HCL ER 120 MG PO CP24: 120.0000 mg | ORAL_CAPSULE | Freq: Every day | ORAL | 11 refills | Status: DC

## 2017-11-18 MED ORDER — CYCLOBENZAPRINE HCL 10 MG PO TABS: 10.0000 mg | ORAL_TABLET | Freq: Three times a day (TID) | ORAL | 6 refills | Status: DC | PRN

## 2017-11-18 NOTE — Progress Notes (Signed)
[facility]    Provider:  Dr Jaynee Eagles  CC:  migraines  Interval history 11/18/2017:  Migraines have been ok, she has not been checking her blood pressure. It has been elevated. When she does get the headaches her amerge works. Contacts may have something to do with migraines. She has neck pain. Discussed taking Amerge right away, she doesn't take it right away. She 5 migraine days a month. Starts as a headache. Caffeine helps.   Interval history 05/08/2017: Here for follow up. 2 moderate migraines a month last 2-3 days so 6 migraine days a month. At this time using imitrex pill and works but comes back. She is taking 9    Medications tried: Onzetra, imitrex oral and nasal spray and powder, Cambia, Zomig, Maxalt none of these worked at [employer] [facility] migraine.   HPI:  Donna Bradshaw is a 32 y.o. female here as a referral from Dr. Drema Dallas for migraines. Migraines since her teenage years. She has throbbing behind the right eyeball and unilateral on the right extending to the neck. He has 5 days of migraines a month. She has nausea, she has vomited in the past. She has to go into a dark room which helps, she is sound sensitive and needs silence to sit still. 7/10 in pain. Unknown triggers except maybe around her period. 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 She takes it then by the end of the day the headache is back. She hates the way it makes her feel, like she is heavy and her chest is heavy. Rizatriptan did not help. No aura. She is having vision changes with the headaches.   Reviewed notes, labs and imaging from outside physicians, which showed: reviewed notes from [facility] physicians. Patient has migraines, h/o hyperthyroidism, menstrual irregularity. Was referred for migraine management. She was evaluated in 06/2014 for thyrotoxicosis, She had a neck pressure sensation x 2 weeks, sh ehad a low TSH and high fT4 with few thyrotoxic sxs: heat intolerance,  insomnia, fatigue,which resolved suspected thyroiditis.   Review of Systems: Patient complains of symptoms per HPI as well as the following symptoms: no CP, no SOB. Pertinent negatives per HPI. All others negative.   Social History   Socioeconomic History  . Marital status: Single    Spouse name: Not on file  . Number of children: 0  . Years of education: 12+  . Highest education level: Not on file  Occupational History  . Occupation: Occupational psychologist  Social Needs  . Financial resource strain: Not on file  . Food insecurity:    Worry: Not on file    Inability: Not on file  . Transportation needs:    Medical: Not on file    Non-medical: Not on file  Tobacco Use  . Smoking status: Never Smoker  . Smokeless tobacco: Never Used  Substance and Sexual Activity  . Alcohol use: Yes    Alcohol/week: 0.0 oz    Comment: occ  . Drug use: No  . Sexual activity: Not on file  Lifestyle  . Physical activity:    Days per week: Not on file    Minutes per session: Not on file  . Stress: Not on file  Relationships  . Social connections:    Talks on phone: Not on file    Gets together: Not on file    Attends religious service: Not on file    Active member of club or organization: Not on file  Attends meetings of clubs or organizations: Not on file    Relationship status: Not on file  . Intimate partner violence:    Fear of current or ex partner: Not on file    Emotionally abused: Not on file    Physically abused: Not on file    Forced sexual activity: Not on file  Other Topics Concern  . Not on file  Social History Narrative   Lives w/ sister.   Caffeine use: 1-2 times per week   Right handed    Family History  Problem Relation Age of Onset  . Diabetes Maternal Uncle   . Diabetes Maternal Grandmother   . Cancer Unknown   . Seizures Unknown   . Breast cancer Paternal Grandmother   . Migraines Neg Hx     Past Medical History:  Diagnosis Date  . Hyperthyroidism    no  longer an issue  . Migraine   . Uterine fibroid     Past Surgical History:  Procedure Laterality Date  . MYOMECTOMY N/A 03/05/2017   Procedure: MYOMECTOMY, ABDOMINAL;  Surgeon: Linda Hedges, DO;  Location: [facility];  Service: Gynecology;  Laterality: N/A;  . NO PAST SURGERIES    . WISDOM TOOTH EXTRACTION      Current Outpatient Medications  Medication Sig Dispense Refill  . ibuprofen (ADVIL,MOTRIN) 200 MG tablet Take 600-800 mg by mouth as needed (for cramps).    . naproxen sodium (ALEVE) 220 MG tablet Take 440 mg by mouth as needed.    . naratriptan (AMERGE) 2.5 MG tablet Take 1 tablet (2.5 mg total) by mouth as needed for migraine. Take one (1) tablet at onset of headache; may repeat once in 2 hours 10 tablet 12  . Norethin Ace-Eth Estrad-FE (MICROGESTIN FE 1.5/30 PO) Take 1 tablet by mouth daily.    . propranolol ER (INDERAL LA) 120 MG 24 hr capsule Take 1 capsule (120 mg total) by mouth at bedtime. 30 capsule 11  . cyclobenzaprine (FLEXERIL) 10 MG tablet Take 1 tablet (10 mg total) by mouth 3 (three) times daily as needed for muscle spasms. And headaches. 90 tablet 6   No current facility-administered medications for this visit.     Allergies as of 11/18/2017  . (No Known Allergies)    Vitals: BP (!) 143/101 (BP Location: Right Arm, Patient Position: Sitting)   Pulse 66   Ht 5\' 4"  (1.626 m)   Wt 194 lb (88 kg)   BMI 33.30 kg/m  Last Weight:  Wt Readings from Last 1 Encounters:  11/18/17 194 lb (88 kg)   Last Height:   Ht Readings from Last 1 Encounters:  11/18/17 5\' 4"  (1.626 m)    Physical exam: Exam: Gen: NAD, conversant, well nourised, obese, well groomed                     CV: RRR, no MRG. No Carotid Bruits. No peripheral edema, warm, nontender Eyes: Conjunctivae clear without exudates or hemorrhage  Neuro: Detailed Neurologic Exam  Speech:    Speech is normal; fluent and spontaneous with normal comprehension.  Cognition:    The patient is oriented to  person, place, and time;     recent and remote memory intact;     language fluent;     normal attention, concentration,     fund of knowledge Cranial Nerves:    The pupils are equal, round, and reactive to light. The fundi are normal and spontaneous venous pulsations are present. Visual fields  are full to finger confrontation. Extraocular movements are intact. Trigeminal sensation is intact and the muscles of mastication are normal. The face is symmetric. The palate elevates in the midline. Hearing intact. Voice is normal. Shoulder shrug is normal. The tongue has normal motion without fasciculations.   Coordination:    Normal finger to nose and heel to shin. Normal rapid alternating movements.   Gait:    Heel-toe and tandem gait are normal.   Motor Observation:    No asymmetry, no atrophy, and no involuntary movements noted. Tone:    Normal muscle tone.    Posture:    Posture is normal. normal erect    Strength:    Strength is V/V in the upper and lower limbs.      Sensation: intact to LT     Reflex Exam:  DTR's:    Deep tendon reflexes in the upper and lower extremities are normal bilaterally.   Toes:    The toes are downgoing bilaterally.   Clonus:    Clonus is absent.      Assessment/Plan:  32 year old with migraines here for follow up  Amerge(Naratriptan): Please take one tablet at the onset of your headache. If it does not improve the symptoms please take one additional tablet. Do not take more then 2 tablets in 24hrs. Do not take use more then 2 to 3 times in a week.  May take with 1000mg  tylenol or 800mg  ibuprofen or 550mg  naproxen  Propranolol 120mg  daily (email if increase needed), refilled and increased  Needs visit to the eye doctor, contacts are worsening migraines  HTN: increse propranolol   Meds ordered this encounter  Medications  . propranolol ER (INDERAL LA) 120 MG 24 hr capsule    Sig: Take 1 capsule (120 mg total) by mouth at bedtime.     Dispense:  30 capsule    Refill:  11  . cyclobenzaprine (FLEXERIL) 10 MG tablet    Sig: Take 1 tablet (10 mg total) by mouth 3 (three) times daily as needed for muscle spasms. And headaches.    Dispense:  90 tablet    Refill:  6    Medications tried: Onzetra, imitrex oral and nasal spray and powder, Cambia, Zomig, Maxalt none of these worked at [employer] [facility] migraine  Discussed: To prevent or relieve headaches, try the following: Cool Compress. Lie down and place a cool compress on your head.  Avoid headache triggers. If certain foods or odors seem to have triggered your migraines in the past, avoid them. A headache diary might help you identify triggers.  Include physical activity in your daily routine. Try a daily walk or other moderate aerobic exercise.  Manage stress. Find healthy ways to cope with the stressors, such as delegating tasks on your to-do list.  Practice relaxation techniques. Try deep breathing, yoga, massage and visualization.  Eat regularly. Eating regularly scheduled meals and maintaining a healthy diet might help prevent headaches. Also, drink plenty of fluids.  Follow a regular sleep schedule. Sleep deprivation might contribute to headaches Consider biofeedback. With this mind-body technique, you learn to control certain bodily functions - such as muscle tension, heart rate and blood pressure - to prevent headaches or reduce headache pain.    Proceed to emergency room if you experience new or worsening symptoms or symptoms do not resolve, if you have new neurologic symptoms or if headache is severe, or for any concerning symptom.   Provided education and documentation from American headache Society toolbox including  articles on: chronic migraine medication overuse headache, chronic migraines, prevention of migraines, behavioral and other nonpharmacologic treatments for headache.   Sarina Ill, MD  [facility] [address] [place],  [place] [address]  Phone [phone] Fax [phone]  A total of 30 minutes was spent face-to-face with this patient. Over half this time was spent on counseling patient on the migraine and HTN diagnosis and different diagnostic and therapeutic options available.

## 2017-11-18 NOTE — Patient Instructions (Addendum)
Email me in 4 weeks and let me know how things are going, monitor blood pressure because we can increase propranolol which is good for migraines and blood pressure.   Cyclobenzaprine tablets What is this medicine? CYCLOBENZAPRINE (sye kloe BEN za preen) is a muscle relaxer. It is used to treat muscle pain, spasms, and stiffness. This medicine may be used for other purposes; ask your health care provider or pharmacist if you have questions. COMMON BRAND NAME(S): Fexmid, Flexeril What should I tell my health care provider before I take this medicine? They need to know if you have any of these conditions: -heart disease, irregular heartbeat, or previous heart attack -liver disease -thyroid problem -an unusual or allergic reaction to cyclobenzaprine, tricyclic antidepressants, lactose, other medicines, foods, dyes, or preservatives -pregnant or trying to get pregnant -breast-feeding How should I use this medicine? Take this medicine by mouth with a glass of water. Follow the directions on the prescription label. If this medicine upsets your stomach, take it with food or milk. Take your medicine at regular intervals. Do not take it more often than directed. Talk to your pediatrician regarding the use of this medicine in children. Special care may be needed. Overdosage: If you think you have taken too much of this medicine contact a poison control center or emergency room at once. NOTE: This medicine is only for you. Do not share this medicine with others. What if I miss a dose? If you miss a dose, take it as soon as you can. If it is almost time for your next dose, take only that dose. Do not take double or extra doses. What may interact with this medicine? Do not take this medicine with any of the following medications: -certain medicines for fungal infections like fluconazole, itraconazole, ketoconazole, posaconazole,  voriconazole -cisapride -dofetilide -dronedarone -halofantrine -levomethadyl -MAOIs like Carbex, Eldepryl, Marplan, Nardil, and Parnate -narcotic medicines for cough -pimozide -thioridazine -ziprasidone This medicine may also interact with the following medications: -alcohol -antihistamines for allergy, cough and cold -certain medicines for anxiety or sleep -certain medicines for cancer -certain medicines for depression like amitriptyline, fluoxetine, sertraline -certain medicines for infection like alfuzosin, chloroquine, clarithromycin, levofloxacin, mefloquine, pentamidine, troleandomycin -certain medicines for irregular heart beat -certain medicines for seizures like phenobarbital, primidone -contrast dyes -general anesthetics like halothane, isoflurane, methoxyflurane, propofol -local anesthetics like lidocaine, pramoxine, tetracaine -medicines that relax muscles for surgery -narcotic medicines for pain -other medicines that prolong the QT interval (cause an abnormal heart rhythm) -phenothiazines like chlorpromazine, mesoridazine, prochlorperazine This list may not describe all possible interactions. Give your health care provider a list of all the medicines, herbs, non-prescription drugs, or dietary supplements you use. Also tell them if you smoke, drink alcohol, or use illegal drugs. Some items may interact with your medicine. What should I watch for while using this medicine? Tell your doctor or health care professional if your symptoms do not start to get better or if they get worse. You may get drowsy or dizzy. Do not drive, use machinery, or do anything that needs mental alertness until you know how this medicine affects you. Do not stand or sit up quickly, especially if you are an older patient. This reduces the risk of dizzy or fainting spells. Alcohol may interfere with the effect of this medicine. Avoid alcoholic drinks. If you are taking another medicine that also  causes drowsiness, you may have more side effects. Give your health care provider a list of all medicines you use. Your doctor will tell  you how much medicine to take. Do not take more medicine than directed. Call emergency for help if you have problems breathing or unusual sleepiness. Your mouth may get dry. Chewing sugarless gum or sucking hard candy, and drinking plenty of water may help. Contact your doctor if the problem does not go away or is severe. What side effects may I notice from receiving this medicine? Side effects that you should report to your doctor or health care professional as soon as possible: -allergic reactions like skin rash, itching or hives, swelling of the face, lips, or tongue -breathing problems -chest pain -fast, irregular heartbeat -hallucinations -seizures -unusually weak or tired Side effects that usually do not require medical attention (report to your doctor or health care professional if they continue or are bothersome): -headache -nausea, vomiting This list may not describe all possible side effects. Call your doctor for medical advice about side effects. You may report side effects to FDA at 1-800-FDA-1088. Where should I keep my medicine? Keep out of the reach of children. Store at room temperature between 15 and 30 degrees C (59 and 86 degrees F). Keep container tightly closed. Throw away any unused medicine after the expiration date. NOTE: This sheet is a summary. It may not cover all possible information. If you have questions about this medicine, talk to your doctor, pharmacist, or health care provider.  2018 Elsevier/Gold Standard (2015-02-22 12:05:46)   Propranolol extended-release capsules What is this medicine? PROPRANOLOL (proe PRAN oh lole) is a beta-blocker. Beta-blockers reduce the workload on the heart and help it to beat more regularly. This medicine is used to treat high blood pressure, heart muscle disease, and prevent chest pain caused  by angina. It is also used to prevent migraine headaches. You should not use this medicine to treat a migraine that has already started. This medicine may be used for other purposes; ask your health care provider or pharmacist if you have questions. COMMON BRAND NAME(S): Inderal LA, Inderal XL, InnoPran XL What should I tell my health care provider before I take this medicine? They need to know if you have any of these conditions: -circulation problems, or blood vessel disease -diabetes -history of heart attack or heart disease, vasospastic angina -kidney disease -liver disease -lung or breathing disease, like asthma or emphysema -pheochromocytoma -slow heart rate -thyroid disease -an unusual or allergic reaction to propranolol, other beta-blockers, medicines, foods, dyes, or preservatives -pregnant or trying to get pregnant -breast-feeding How should I use this medicine? Take this medicine by mouth with a glass of water. Follow the directions on the prescription label. Do not crush or chew. Take your doses at regular intervals. Do not take your medicine more often than directed. Do not stop taking except on the advice of your doctor or health care professional. Talk to your pediatrician regarding the use of this medicine in children. Special care may be needed. Overdosage: If you think you have taken too much of this medicine contact a poison control center or emergency room at once. NOTE: This medicine is only for you. Do not share this medicine with others. What if I miss a dose? If you miss a dose, take it as soon as you can. If it is almost time for your next dose, take only that dose. Do not take double or extra doses. What may interact with this medicine? Do not take this medicine with any of the following medications: -feverfew -phenothiazines like chlorpromazine, mesoridazine, prochlorperazine, thioridazine This medicine may also interact with  the following  medications: -aluminum hydroxide gel -antipyrine -antiviral medicines for HIV or AIDS -barbiturates like phenobarbital -certain medicines for blood pressure, heart disease, irregular heart beat -cimetidine -ciprofloxacin -diazepam -fluconazole -haloperidol -isoniazid -medicines for cholesterol like cholestyramine or colestipol -medicines for mental depression -medicines for migraine headache like almotriptan, eletriptan, frovatriptan, naratriptan, rizatriptan, sumatriptan, zolmitriptan -NSAIDs, medicines for pain and inflammation, like ibuprofen or naproxen -phenytoin -rifampin -teniposide -theophylline -thyroid medicines -tolbutamide -warfarin -zileuton This list may not describe all possible interactions. Give your health care provider a list of all the medicines, herbs, non-prescription drugs, or dietary supplements you use. Also tell them if you smoke, drink alcohol, or use illegal drugs. Some items may interact with your medicine. What should I watch for while using this medicine? Visit your doctor or health care professional for regular check ups. Contact your doctor right away if your symptoms worsen. Check your blood pressure and pulse rate regularly. Ask your health care professional what your blood pressure and pulse rate should be, and when you should contact them. Do not stop taking this medicine suddenly. This could lead to serious heart-related effects. You may get drowsy or dizzy. Do not drive, use machinery, or do anything that needs mental alertness until you know how this drug affects you. Do not stand or sit up quickly, especially if you are an older patient. This reduces the risk of dizzy or fainting spells. Alcohol can make you more drowsy and dizzy. Avoid alcoholic drinks. This medicine can affect blood sugar levels. If you have diabetes, check with your doctor or health care professional before you change your diet or the dose of your diabetic medicine. Do not  treat yourself for coughs, colds, or pain while you are taking this medicine without asking your doctor or health care professional for advice. Some ingredients may increase your blood pressure. What side effects may I notice from receiving this medicine? Side effects that you should report to your doctor or health care professional as soon as possible: -allergic reactions like skin rash, itching or hives, swelling of the face, lips, or tongue -breathing problems -changes in blood sugar -cold hands or feet -difficulty sleeping, nightmares -dry peeling skin -hallucinations -muscle cramps or weakness -slow heart rate -swelling of the legs and ankles -vomiting Side effects that usually do not require medical attention (report to your doctor or health care professional if they continue or are bothersome): -change in sex drive or performance -diarrhea -dry sore eyes -hair loss -nausea -weak or tired This list may not describe all possible side effects. Call your doctor for medical advice about side effects. You may report side effects to FDA at 1-800-FDA-1088. Where should I keep my medicine? Keep out of the reach of children. Store at room temperature between 15 and 30 degrees C (59 and 86 degrees F). Protect from light, moisture and freezing. Keep container tightly closed. Throw away any unused medicine after the expiration date. NOTE: This sheet is a summary. It may not cover all possible information. If you have questions about this medicine, talk to your doctor, pharmacist, or health care provider.  2018 Elsevier/Gold Standard (2013-01-16 14:58:56)

## 2018-01-09 ENCOUNTER — Other Ambulatory Visit: Payer: Self-pay | Admitting: Gastroenterology

## 2018-01-09 DIAGNOSIS — R1032 Left lower quadrant pain: Secondary | ICD-10-CM

## 2018-01-16 ENCOUNTER — Ambulatory Visit
Admission: RE | Admit: 2018-01-16 | Discharge: 2018-01-16 | Disposition: A | Discharge status: Home / Self Care | Payer: BLUE CROSS/BLUE SHIELD | Source: Ambulatory Visit | Attending: Gastroenterology | Admitting: Gastroenterology

## 2018-01-16 DIAGNOSIS — R1032 Left lower quadrant pain: Secondary | ICD-10-CM

## 2018-01-16 MED ORDER — IOPAMIDOL (ISOVUE-300) INJECTION 61%
100.0000 mL | Freq: Once | INTRAVENOUS | Status: AC | PRN
  Administered 2018-01-16: 100 mL via INTRAVENOUS

## 2018-05-23 ENCOUNTER — Other Ambulatory Visit: Payer: Self-pay | Admitting: Neurology

## 2018-10-17 IMAGING — CT CT ABD-PELV W/ CM
1 of 2 series · 13 of 32 positions shown, 18 images · IV contrast (APPLIED)
Comparison: 06/15/2015 pelvic ultrasound.

CLINICAL DATA: Left lower quadrant pain for 2 years. Prior
myomectomy.

EXAM:
CT ABDOMEN AND PELVIS WITH CONTRAST
TECHNIQUE: Multidetector CT imaging of the abdomen and pelvis was performed
using the standard protocol following bolus administration of
intravenous contrast.
CONTRAST:  100mL 2O2ICS-1CC IOPAMIDOL (2O2ICS-1CC) INJECTION 61%

[Series 2: abd/pelvis w/cm · axial · 0.73mm/px · z∈[-445,-10]mm · 13 of 99 slices shown, 18 images]
[im 6/99  soft-tissue]
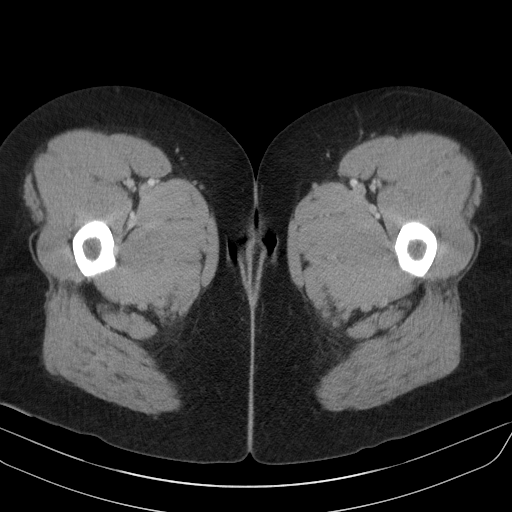
[im 6/99  bone]
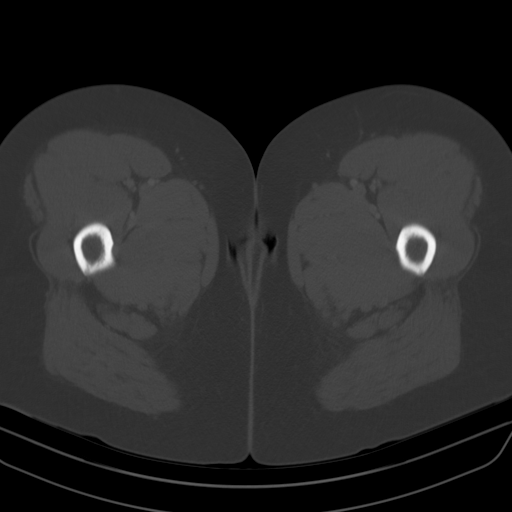
[im 17/99  soft-tissue]
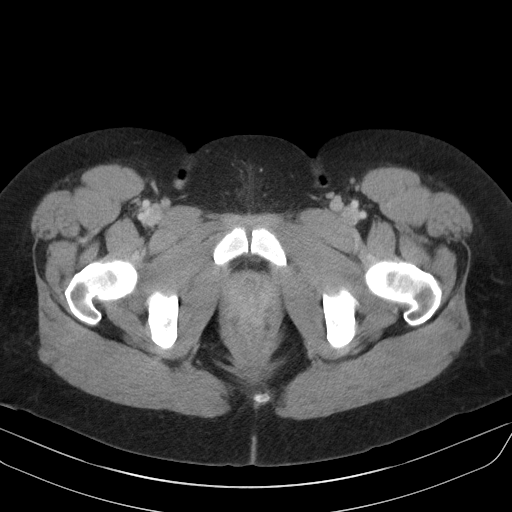
[im 22/99  soft-tissue]
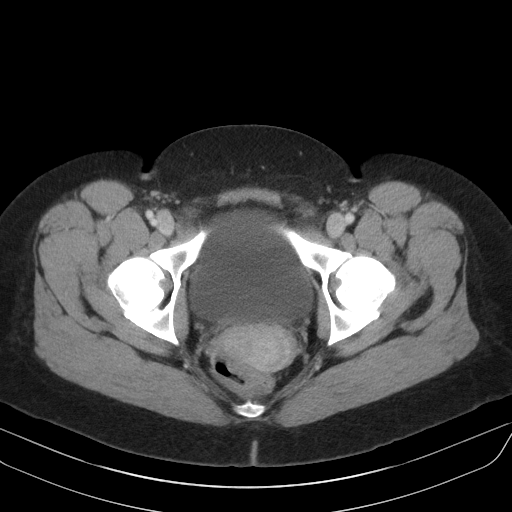
[im 28/99  soft-tissue]
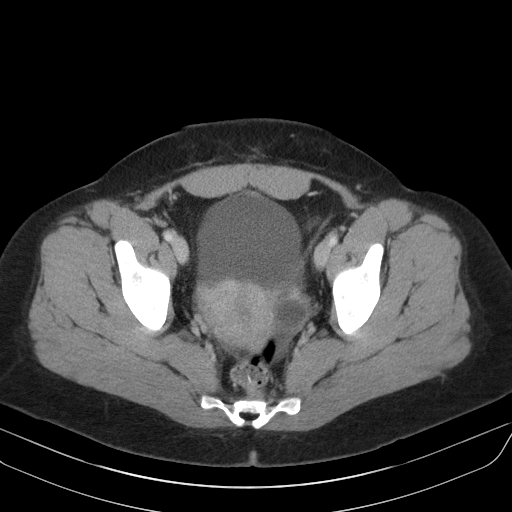
[im 39/99  soft-tissue]
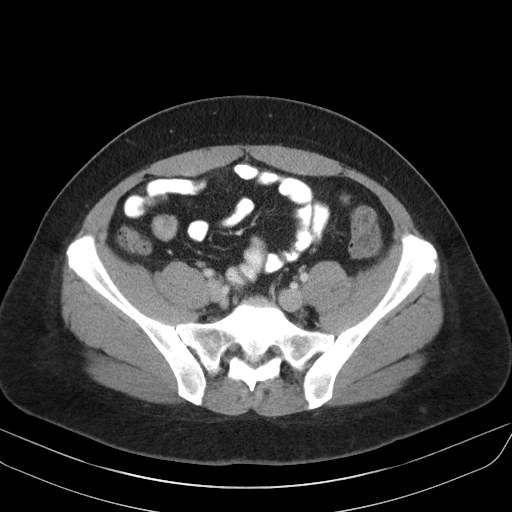
[im 44/99  soft-tissue]
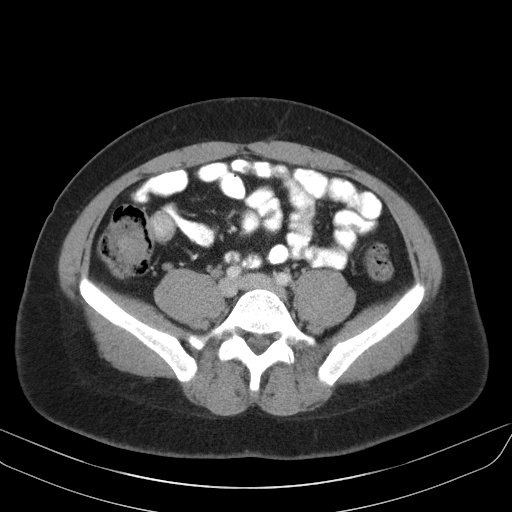
[im 55/99  soft-tissue]
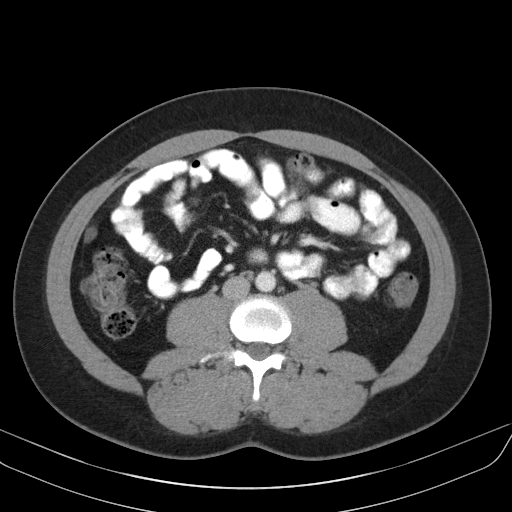
[im 60/99  soft-tissue]
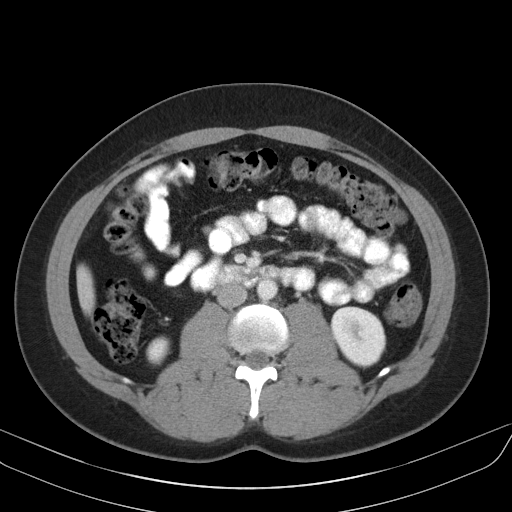
[im 71/99  soft-tissue]
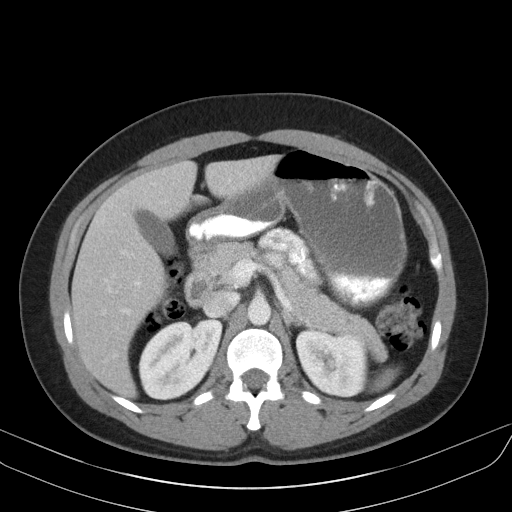
[im 71/99  bone]
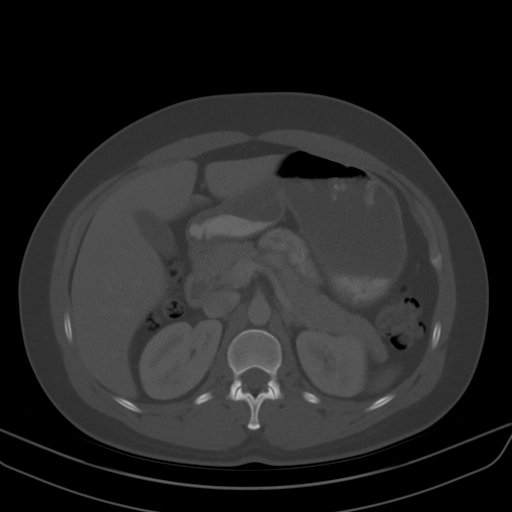
[im 77/99  soft-tissue]
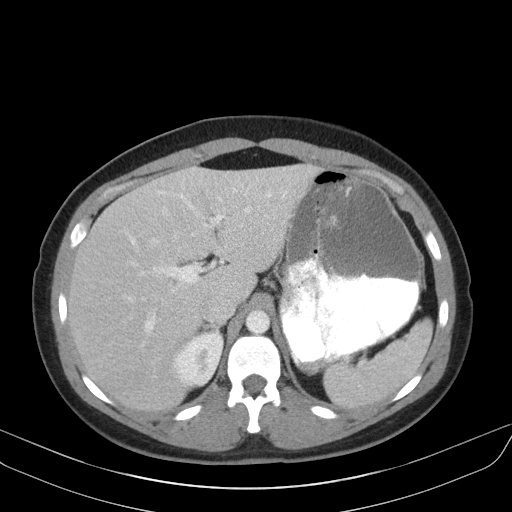
[im 77/99  lung]
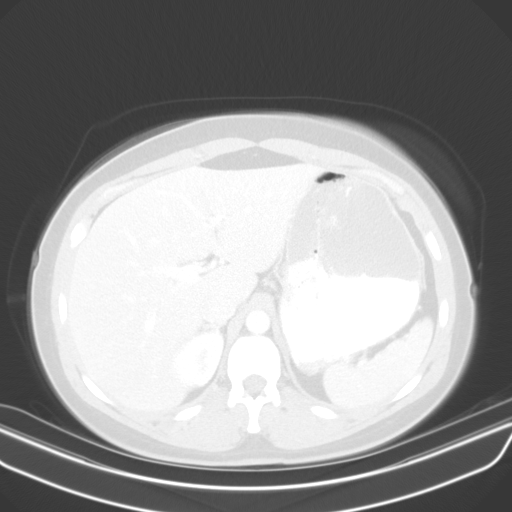
[im 82/99  soft-tissue]
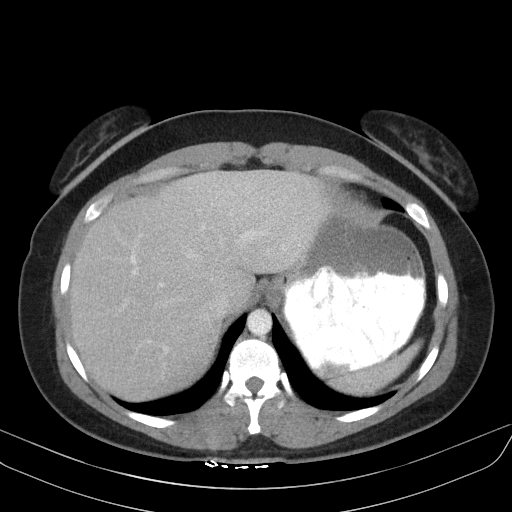
[im 82/99  lung]
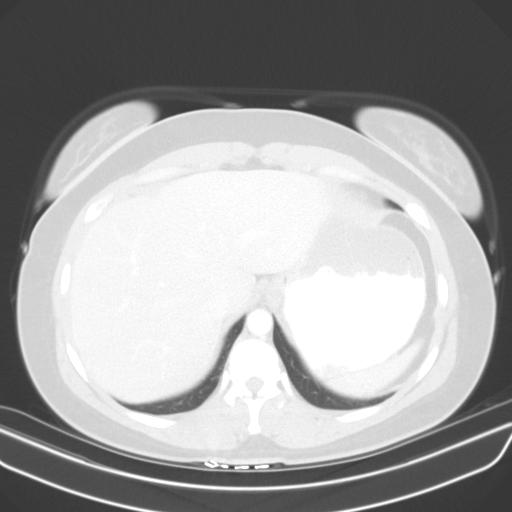
[im 88/99  lung]
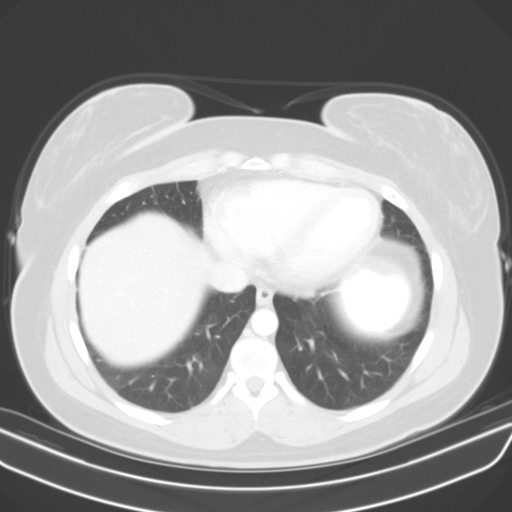
[im 93/99  soft-tissue]
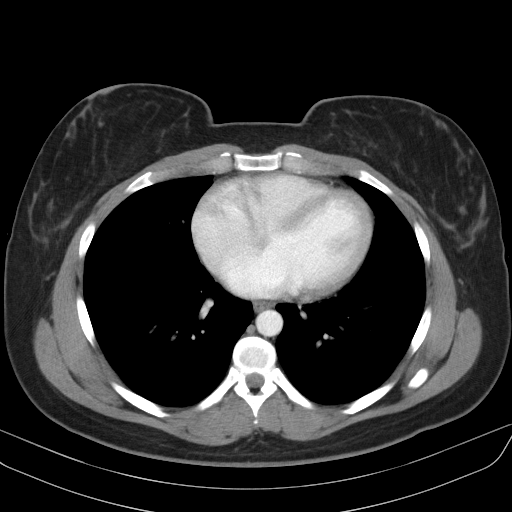
[im 93/99  lung]
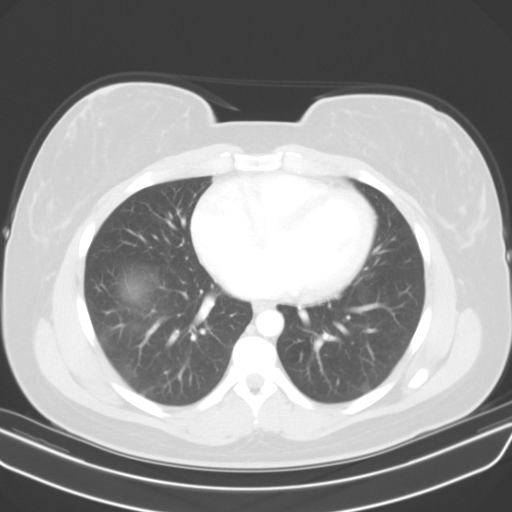

[13 of 32 positions shown; findings below may reference images not displayed]

FINDINGS: Lower chest: Bibasilar scarring or subsegmental atelectasis. Normal
heart size without pericardial or pleural effusion.

Hepatobiliary: Segment 4 a subcentimeter low-density lesion is
well-circumscribed and likely a cyst. Focus of vague
hyperenhancement within the right lobe of the liver measures 9 mm on
[DATE] and may be subtly present on a chest CT of 02/24/2007, favoring
a hemangioma.

Normal gallbladder, without biliary ductal dilatation.

Pancreas: Normal, without mass or ductal dilatation.

Spleen: Normal in size, without focal abnormality.

Adrenals/Urinary Tract: Normal adrenal glands. Normal kidneys,
without hydronephrosis. Normal urinary bladder.

Stomach/Bowel: Normal stomach, without wall thickening. Colonic
stool burden suggests constipation. Normal terminal ileum. The
appendix is mildly enlarged including at 10 mm on 64/2. Subtle
mucosal hyperenhancement. No surrounding edema.

Normal small bowel.

Vascular/Lymphatic: Normal caliber of the aorta and branch vessels.
No abdominopelvic adenopathy.

Reproductive: Normal uterus. A left ovarian well-circumscribed
hypoattenuating lesion measures 2.2 cm.

Other: Trace free pelvic fluid is likely physiologic. Low-density
right abdominal structure measures 1.2 cm along the inferior right
omentum on 45/2.

Musculoskeletal: No acute osseous abnormality.
IMPRESSION: 1. Left ovarian dominant follicle or cyst. Otherwise, no explanation
for left-sided pain
2. Enlarged appendix with suspicion of mild appendiceal
hyperenhancement. Correlate with any symptoms to suggest acute
appendicitis. If there are no such symptoms, potential clinical
strategies include CT or MRI follow-up at 3 months versus a more
aggressive approach with colonoscopy and attention to the
appendiceal orifice (to exclude unlikely mucocele).
3. Small low-density structure within the inferior right abdominal
omentum is of indeterminate etiology. Presuming no history of
primary malignancy, most likely an incidental focus of contained
fluid or an implant in the setting of endometriosis.
4.  Trace free pelvic fluid is likely physiologic.

These results will be called to the ordering clinician or
representative by the Radiologist Assistant, and communication
documented in the PACS or zVision Dashboard.

## 2018-11-20 ENCOUNTER — Telehealth: Payer: Self-pay | Admitting: *Deleted

## 2018-11-20 NOTE — Telephone Encounter (Signed)
Called pt and LVM (ok per DPR) asking for call back. Asked if we could switch her appt to a VV or telephone call d/t the coronavirus pandemic. Asked for call back by tomorrow morning or appt would be canceled. Left office number in message.

## 2018-11-24 ENCOUNTER — Ambulatory Visit: Payer: BLUE CROSS/BLUE SHIELD | Admitting: Neurology

## 2018-12-08 ENCOUNTER — Other Ambulatory Visit: Payer: Self-pay | Admitting: *Deleted

## 2018-12-08 ENCOUNTER — Encounter: Payer: Self-pay | Admitting: *Deleted

## 2018-12-08 MED ORDER — PROPRANOLOL HCL ER 120 MG PO CP24: 120.0000 mg | ORAL_CAPSULE | Freq: Every day | ORAL | 0 refills | Status: DC

## 2019-01-28 ENCOUNTER — Telehealth: Payer: Self-pay | Admitting: *Deleted

## 2019-01-28 ENCOUNTER — Ambulatory Visit: Payer: Self-pay | Admitting: Neurology

## 2019-01-28 MED ORDER — PROPRANOLOL HCL ER 120 MG PO CP24: 120.0000 mg | ORAL_CAPSULE | Freq: Every day | ORAL | 0 refills | Status: DC

## 2019-01-28 MED ORDER — NARATRIPTAN HCL 2.5 MG PO TABS: ORAL_TABLET | ORAL | 0 refills | Status: DC

## 2019-01-28 NOTE — Telephone Encounter (Signed)
Spoke with pt today and advised MD out of office unexpectedly. Pt unable to see NP at available times this week. She requested an afternoon appointment. I r/s her to Tues 02/17/2019 @ 2:00 pm arrival 15-30 minutes early. She verbalized appreciation and asked for refills Propranolol and Naratriptan as she is almost out.   Medications refilled x 1 month supply pending appt.

## 2019-02-17 ENCOUNTER — Other Ambulatory Visit: Payer: Self-pay

## 2019-02-17 ENCOUNTER — Encounter: Payer: Self-pay | Admitting: Family Medicine

## 2019-02-17 ENCOUNTER — Ambulatory Visit: Payer: BC Managed Care – PPO | Admitting: Family Medicine

## 2019-02-17 VITALS — BP 136/97 | HR 65 | Temp 98.0°F | Ht 64.0 in | Wt 214.0 lb

## 2019-02-17 DIAGNOSIS — G43009 Migraine without aura, not intractable, without status migrainosus: Secondary | ICD-10-CM | POA: Diagnosis not present

## 2019-02-17 MED ORDER — PROPRANOLOL HCL ER 120 MG PO CP24: 120.0000 mg | ORAL_CAPSULE | Freq: Every day | ORAL | 3 refills | Status: DC

## 2019-02-17 MED ORDER — NARATRIPTAN HCL 2.5 MG PO TABS: ORAL_TABLET | ORAL | 11 refills | Status: DC

## 2019-02-17 NOTE — Progress Notes (Signed)
PATIENT: Donna Bradshaw DOB: March 28, 1986  REASON FOR VISIT: follow up HISTORY FROM: patient  Chief Complaint  Patient presents with  . Follow-up    New room, alone. Migraine f/u "Still having migraines. No concerns."     HISTORY OF PRESENT ILLNESS: Today 02/17/19 Donna Bradshaw is a 33 y.o. female here today for follow up for migraines. She continues propranolol for prevention and Amerge for abortive therapy.  She reports that migraines are about the same. She has had an increase in frequency and intensity over the past two months. Baseline was 6 migraines per month, recently she has had 8-9 per month. She is going through a full rx of Amerge. She tries to avoid OTC analgesics but reports that there are some weeks where she has taken Aleve 3 times weekly. Usually Amerge, Aleve and Flexeril will abort headache. She has also noted increased BP. She has seen PCP but readings were normal at visit so she has continued to monitor. She is also under more stress at work.   HISTORY: (copied from Dr Cathren Laine note on 11/18/2017)  Interval history 11/18/2017:  Migraines have been ok, she has not been checking her blood pressure. It has been elevated. When she does get the headaches her amerge works. Contacts may have something to do with migraines. She has neck pain. Discussed taking Amerge right away, she doesn't take it right away. She 5 migraine days a month. Starts as a headache. Caffeine helps.   Interval history 05/08/2017: Here for follow up. 2 moderate migraines a month last 2-3 days so 6 migraine days a month. At this time using imitrex pill and works but comes back. She is taking 9   Medications tried: Onzetra, imitrex oral and nasal spray and powder, Cambia, Zomig, Maxalt none of these worked at [employer] migraine.   [id number] Holdenis a 33 y.o.femalehere as a referral from Dr. Pat Kocher migraines. Migraines since her teenage years. She has throbbing behind the right eyeball and  unilateral on the right extending to the neck. He has 5 days of migraines a month. She has nausea, she has vomited in the past. She has to go into a dark room which helps, she is sound sensitive and needs silence to sit still. 7/10 in pain. Unknown triggers except maybe around her period. 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 She takes it then by the end of the day the headache is back. She hates the way it makes her feel, like she is heavy and her chest is heavy. Rizatriptan did not help. No aura. She is having vision changes with the headaches.   Reviewed notes, labs and imaging from outside physicians, which showed: reviewed notes from [facility] physicians. Patient has migraines, h/o hyperthyroidism, menstrual irregularity. Was referred for migraine management. She was evaluated in 06/2014 for thyrotoxicosis, She had a neck pressure sensation x 2 weeks, sh ehad a low TSH and high fT4 with few thyrotoxic sxs: heat intolerance, insomnia, fatigue,which resolved suspected thyroiditis.    REVIEW OF SYSTEMS: Out of a complete 14 system review of symptoms, the patient complains only of the following symptoms, headaches and all other reviewed systems are negative.   ALLERGIES: No Known Allergies  HOME MEDICATIONS: Outpatient Medications Prior to Visit  Medication Sig Dispense Refill  . cyclobenzaprine (FLEXERIL) 10 MG tablet Take 1 tablet (10 mg total) by mouth 3 (three) times daily as needed for muscle spasms. And headaches. 90 tablet 6  . ibuprofen (  ADVIL,MOTRIN) 200 MG tablet Take 600-800 mg by mouth as needed (for cramps).    . Multiple Vitamin (MULTIVITAMIN) capsule Take by mouth.    . naproxen sodium (ALEVE) 220 MG tablet Take 440 mg by mouth as needed.    . naratriptan (AMERGE) 2.5 MG tablet TAKE 1 TABLET BY MOUTH AT ONSET OF HEADACHE; MAY REPEAT ONCE IN 2 HOURS. AS NEEDED FOR MIGRAINE. 9 tablet 0  . propranolol ER (INDERAL LA) 120 MG 24 hr capsule Take 1 capsule (120  mg total) by mouth at bedtime. Please keep pending appointment for further refills. 30 capsule 0  . Norethin Ace-Eth Estrad-FE (MICROGESTIN FE 1.5/30 PO) Take 1 tablet by mouth daily.     No facility-administered medications prior to visit.     PAST MEDICAL HISTORY: Past Medical History:  Diagnosis Date  . Hyperthyroidism    no longer an issue  . Migraine   . Uterine fibroid     PAST SURGICAL HISTORY: Past Surgical History:  Procedure Laterality Date  . MYOMECTOMY N/A 03/05/2017   Procedure: MYOMECTOMY, ABDOMINAL;  Surgeon: Linda Hedges, DO;  Location: [facility];  Service: Gynecology;  Laterality: N/A;  . NO PAST SURGERIES    . WISDOM TOOTH EXTRACTION      FAMILY HISTORY: Family History  Problem Relation Age of Onset  . Diabetes Maternal Uncle   . Diabetes Maternal Grandmother   . Cancer Unknown   . Seizures Unknown   . Breast cancer Paternal Grandmother   . Migraines Neg Hx     SOCIAL HISTORY: Social History   Socioeconomic History  . Marital status: Single    Spouse name: Not on file  . Number of children: 0  . Years of education: 12+  . Highest education level: Not on file  Occupational History  . Occupation: Occupational psychologist  Social Needs  . Financial resource strain: Not on file  . Food insecurity    Worry: Not on file    Inability: Not on file  . Transportation needs    Medical: Not on file    Non-medical: Not on file  Tobacco Use  . Smoking status: Never Smoker  . Smokeless tobacco: Never Used  Substance and Sexual Activity  . Alcohol use: Yes    Alcohol/week: 0.0 standard drinks    Comment: occ  . Drug use: No  . Sexual activity: Not on file  Lifestyle  . Physical activity    Days per week: Not on file    Minutes per session: Not on file  . Stress: Not on file  Relationships  . Social Herbalist on phone: Not on file    Gets together: Not on file    Attends religious service: Not on file    Active member of club or organization: Not  on file    Attends meetings of clubs or organizations: Not on file    Relationship status: Not on file  . Intimate partner violence    Fear of current or ex partner: Not on file    Emotionally abused: Not on file    Physically abused: Not on file    Forced sexual activity: Not on file  Other Topics Concern  . Not on file  Social History Narrative   Lives w/ sister.   Caffeine use: 1-2 times per week   Right handed      PHYSICAL EXAM  Vitals:   02/17/19 1412  BP: (!) 136/97  Pulse: 65  Temp: 98 F (  36.7 C)  Weight: 214 lb (97.1 kg)  Height: 5\' 4"  (1.626 m)   Body mass index is 36.73 kg/m.  Generalized: Well developed, in no acute distress  Cardiology: normal rate and rhythm, no murmur noted Neurological examination  Mentation: Alert oriented to time, place, history taking. Follows all commands speech and language fluent Cranial nerve II-XII: Pupils were equal round reactive to light. Extraocular movements were full, visual field were full on confrontational test. Facial sensation and strength were normal. Uvula tongue midline. Head turning and shoulder shrug  were normal and symmetric. Motor: The motor testing reveals 5 over 5 strength of all 4 extremities. Good symmetric motor tone is noted throughout.  Sensory: Sensory testing is intact to soft touch on all 4 extremities. No evidence of extinction is noted.  Coordination: Cerebellar testing reveals good finger-nose-finger and heel-to-shin bilaterally.  Gait and station: Gait is normal.   DIAGNOSTIC DATA (LABS, IMAGING, TESTING) - I reviewed patient records, labs, notes, testing and imaging myself where available.  No flowsheet data found.   Lab Results  Component Value Date   WBC 12.2 (H) 03/06/2017   HGB 7.6 (L) 03/06/2017   HCT 22.2 (L) 03/06/2017   MCV 88.8 03/06/2017   PLT 236 03/06/2017      Component Value Date/Time   NA 134 (L) 02/25/2017 1025   NA 140 06/14/2015 0841   K 3.7 02/25/2017 1025   CL  104 02/25/2017 1025   CO2 24 02/25/2017 1025   GLUCOSE 96 02/25/2017 1025   BUN 6 02/25/2017 1025   BUN 9 06/14/2015 0841   CREATININE 1.01 (H) 02/25/2017 1025   CALCIUM 8.6 (L) 02/25/2017 1025   PROT 7.2 02/25/2017 1025   PROT 6.7 06/14/2015 0841   ALBUMIN 3.7 02/25/2017 1025   ALBUMIN 3.8 06/14/2015 0841   AST 19 02/25/2017 1025   ALT 17 02/25/2017 1025   ALKPHOS 32 (L) 02/25/2017 1025   BILITOT 1.0 02/25/2017 1025   BILITOT 0.5 06/14/2015 0841   GFRNONAA >60 02/25/2017 1025   GFRAA >60 02/25/2017 1025   No results found for: CHOL, HDL, LDLCALC, LDLDIRECT, TRIG, CHOLHDL No results found for: HGBA1C No results found for: VITAMINB12 Lab Results  Component Value Date   TSH 0.11 (L) 07/21/2014       ASSESSMENT AND PLAN 33 y.o. year old female  has a past medical history of Hyperthyroidism, Migraine, and Uterine fibroid. here with     ICD-10-CM   1. Migraine without aura and without status migrainosus, not intractable  G43.009     Jalexis is tolerating propranolol ER 120 mg daily for migraine prevention as well as Amerge 2.5 mg as needed for abortive therapy.  We will continue current therapy.  She is hesitant to start another medication at this time.  She is concerned that her's may be elevated.  Reading in the office today is 136/97.  She would like to continue monitoring pressures at home and follow-up with PCP should readings remain elevated.  I have asked that she reach out to me in about 4 weeks with a progress report.  We have discussed both amitriptyline and topiramate.  We will consider adding 1 of these medications for prevention therapy if needed.  She was encouraged to stay well-hydrated and consider exercise for stress management.  She will follow-up with me in 1 year if migraines improve, sooner if needed.  She verbalizes understanding and agreement with this plan.   No orders of the defined types were placed in  this encounter.    Meds ordered this encounter   Medications  . propranolol ER (INDERAL LA) 120 MG 24 hr capsule    Sig: Take 1 capsule (120 mg total) by mouth at bedtime. Please keep pending appointment for further refills.    Dispense:  90 capsule    Refill:  3    Order Specific Question:   Supervising Provider    Answer:   Melvenia Beam [id number]  . naratriptan (AMERGE) 2.5 MG tablet    Sig: TAKE 1 TABLET BY MOUTH AT ONSET OF HEADACHE; MAY REPEAT ONCE IN 2 HOURS. AS NEEDED FOR MIGRAINE.    Dispense:  9 tablet    Refill:  11    Pt must keep pending appt for further refills.    Order Specific Question:   Supervising Provider    Answer:   Melvenia Beam [id number]      I spent 15 minutes with the patient. 50% of this time was spent counseling and educating patient on plan of care and medications.    Debbora Presto, FNP-C 02/17/2019, 2:45 PM [facility] [address] [place], [place] [address] [phone]

## 2019-02-17 NOTE — Patient Instructions (Addendum)
Continue propranolol and Amerge.   Call/send message on MyChart with progress report and BP readings in about 1 month  We will reassess adding another preventative (amitriptyline or topiramate)  Follow up in 1 year if migraines improve.   Migraine Headache A migraine headache is a very strong throbbing pain on one side or both sides of your head. This type of headache can also cause other symptoms. It can last from 4 hours to 3 days. Talk with your doctor about what things may bring on (trigger) this condition. What are the causes? The exact cause of this condition is not known. This condition may be triggered or caused by:  Drinking alcohol.  Smoking.  Taking medicines, such as: ? Medicine used to treat chest pain (nitroglycerin). ? Birth control pills. ? Estrogen. ? Some blood pressure medicines.  Eating or drinking certain products.  Doing physical activity. Other things that may trigger a migraine headache include:  Having a menstrual period.  Pregnancy.  Hunger.  Stress.  Not getting enough sleep or getting too much sleep.  Weather changes.  Tiredness (fatigue). What increases the risk?  Being 35-47 years old.  Being female.  Having a family history of migraine headaches.  Being Caucasian.  Having depression or anxiety.  Being very overweight. What are the signs or symptoms?  A throbbing pain. This pain may: ? Happen in any area of the head, such as on one side or both sides. ? Make it hard to do daily activities. ? Get worse with physical activity. ? Get worse around bright lights or loud noises.  Other symptoms may include: ? Feeling sick to your stomach (nauseous). ? Vomiting. ? Dizziness. ? Being sensitive to bright lights, loud noises, or smells.  Before you get a migraine headache, you may get warning signs (an aura). An aura may include: ? Seeing flashing lights or having blind spots. ? Seeing bright spots, halos, or zigzag lines. ?  Having tunnel vision or blurred vision. ? Having numbness or a tingling feeling. ? Having trouble talking. ? Having weak muscles.  Some people have symptoms after a migraine headache (postdromal phase), such as: ? Tiredness. ? Trouble thinking (concentrating). How is this treated?  Taking medicines that: ? Relieve pain. ? Relieve the feeling of being sick to your stomach. ? Prevent migraine headaches.  Treatment may also include: ? Having acupuncture. ? Avoiding foods that bring on migraine headaches. ? Learning ways to control your body functions (biofeedback). ? Therapy to help you know and deal with negative thoughts (cognitive behavioral therapy). Follow these instructions at home: Medicines  Take over-the-counter and prescription medicines only as told by your doctor.  Ask your doctor if the medicine prescribed to you: ? Requires you to avoid driving or using heavy machinery. ? Can cause trouble pooping (constipation). You may need to take these steps to prevent or treat trouble pooping:  Drink enough fluid to keep your pee (urine) pale yellow.  Take over-the-counter or prescription medicines.  Eat foods that are high in fiber. These include beans, whole grains, and fresh fruits and vegetables.  Limit foods that are high in fat and sugar. These include fried or sweet foods. Lifestyle  Do not drink alcohol.  Do not use any products that contain nicotine or tobacco, such as cigarettes, e-cigarettes, and chewing tobacco. If you need help quitting, ask your doctor.  Get at least 8 hours of sleep every night.  Limit and deal with stress. General instructions  Keep a journal to find out what may bring on your migraine headaches. For example, write down: ? What you eat and drink. ? How much sleep you get. ? Any change in what you eat or drink. ? Any change in your medicines.  If you have a migraine headache: ? Avoid things that make your symptoms worse, such  as bright lights. ? It may help to lie down in a dark, quiet room. ? Do not drive or use heavy machinery. ? Ask your doctor what activities are safe for you.  Keep all follow-up visits as told by your doctor. This is important. Contact a doctor if:  You get a migraine headache that is different or worse than others you have had.  You have more than 15 headache days in one month. Get help right away if:  Your migraine headache gets very bad.  Your migraine headache lasts longer than 72 hours.  You have a fever.  You have a stiff neck.  You have trouble seeing.  Your muscles feel weak or like you cannot control them.  You start to lose your balance a lot.  You start to have trouble walking.  You pass out (faint).  You have a seizure. Summary  A migraine headache is a very strong throbbing pain on one side or both sides of your head. These headaches can also cause other symptoms.  This condition may be treated with medicines and changes to your lifestyle.  Keep a journal to find out what may bring on your migraine headaches.  Contact a doctor if you get a migraine headache that is different or worse than others you have had.  Contact your doctor if you have more than 15 headache days in a month. This information is not intended to replace advice given to you by your health care provider. Make sure you discuss any questions you have with your health care provider. Document Released: 02/21/2008 Document Revised: 09/05/2018 Document Reviewed: 06/26/2018 Elsevier Patient Education  2020 [employer].

## 2019-02-17 NOTE — Progress Notes (Signed)
Made any corrections needed, and agree with history, physical, neuro exam,assessment and plan as stated.     Antonia Ahern, MD [facility]  

## 2019-09-03 ENCOUNTER — Telehealth: Payer: Self-pay | Admitting: Nurse Practitioner

## 2019-09-03 NOTE — Telephone Encounter (Signed)
Call Pt left message on voicemail to call the office to reschedule appointment for 09/07/19

## 2019-09-07 ENCOUNTER — Ambulatory Visit: Payer: BLUE CROSS/BLUE SHIELD | Admitting: Nurse Practitioner

## 2019-09-10 ENCOUNTER — Other Ambulatory Visit: Payer: Self-pay

## 2019-09-10 ENCOUNTER — Encounter: Payer: Self-pay | Admitting: Nurse Practitioner

## 2019-09-10 ENCOUNTER — Ambulatory Visit (INDEPENDENT_AMBULATORY_CARE_PROVIDER_SITE_OTHER): Payer: Self-pay | Admitting: Nurse Practitioner

## 2019-09-10 VITALS — BP 133/81 | HR 62 | Temp 98.2°F | Ht 64.0 in | Wt 210.8 lb

## 2019-09-10 DIAGNOSIS — G43009 Migraine without aura, not intractable, without status migrainosus: Secondary | ICD-10-CM

## 2019-09-10 DIAGNOSIS — I10 Essential (primary) hypertension: Secondary | ICD-10-CM

## 2019-09-10 DIAGNOSIS — Z9889 Other specified postprocedural states: Secondary | ICD-10-CM

## 2019-09-10 DIAGNOSIS — R7989 Other specified abnormal findings of blood chemistry: Secondary | ICD-10-CM

## 2019-09-10 DIAGNOSIS — Z0000 Encounter for general adult medical examination without abnormal findings: Secondary | ICD-10-CM

## 2019-09-10 DIAGNOSIS — R1084 Generalized abdominal pain: Secondary | ICD-10-CM

## 2019-09-10 LAB — POCT GLYCOSYLATED HEMOGLOBIN (HGB A1C): Hemoglobin A1C: 5.4 % (ref 4.0–5.6)

## 2019-09-10 LAB — POCT URINALYSIS DIPSTICK
Bilirubin, UA: NEGATIVE
Blood, UA: NEGATIVE
Glucose, UA: NEGATIVE
Ketones, UA: NEGATIVE
Leukocytes, UA: NEGATIVE
Nitrite, UA: NEGATIVE
Protein, UA: NEGATIVE
Spec Grav, UA: >=1.03 — AB (ref 1.010–1.025)
Urobilinogen, UA: 0.2 E.U./dL
pH, UA: 6 (ref 5.0–8.0)

## 2019-09-10 LAB — GLUCOSE, POCT (MANUAL RESULT ENTRY): POC Glucose: 108 mg/dl — AB (ref 70–99)

## 2019-09-10 MED ORDER — PROPRANOLOL HCL ER 160 MG PO CP24: 160.0000 mg | ORAL_CAPSULE | Freq: Every day | ORAL | 1 refills | Status: DC

## 2019-09-10 NOTE — Patient Instructions (Signed)
Managing Your Hypertension Hypertension is commonly called high blood pressure. This is when the force of your blood pressing against the walls of your arteries is too strong. Arteries are blood vessels that carry blood from your heart throughout your body. Hypertension forces the heart to work harder to pump blood, and may cause the arteries to become narrow or stiff. Having untreated or uncontrolled hypertension can cause heart attack, stroke, kidney disease, and other problems. What are blood pressure readings? A blood pressure reading consists of a higher number over a lower number. Ideally, your blood pressure should be below 120/80. The first ("top") number is called the systolic pressure. It is a measure of the pressure in your arteries as your heart beats. The second ("bottom") number is called the diastolic pressure. It is a measure of the pressure in your arteries as the heart relaxes. What does my blood pressure reading mean? Blood pressure is classified into four stages. Based on your blood pressure reading, your health care provider may use the following stages to determine what type of treatment you need, if any. Systolic pressure and diastolic pressure are measured in a unit called mm Hg. Normal  Systolic pressure: below 120.  Diastolic pressure: below 80. Elevated  Systolic pressure: 120-129.  Diastolic pressure: below 80. Hypertension stage 1  Systolic pressure: 130-139.  Diastolic pressure: 80-89. Hypertension stage 2  Systolic pressure: 140 or above.  Diastolic pressure: 90 or above. What health risks are associated with hypertension? Managing your hypertension is an important responsibility. Uncontrolled hypertension can lead to:  A heart attack.  A stroke.  A weakened blood vessel (aneurysm).  Heart failure.  Kidney damage.  Eye damage.  Metabolic syndrome.  Memory and concentration problems. What changes can I make to manage my  hypertension? Hypertension can be managed by making lifestyle changes and possibly by taking medicines. Your health care provider will help you make a plan to bring your blood pressure within a normal range. Eating and drinking   Eat a diet that is high in fiber and potassium, and low in salt (sodium), added sugar, and fat. An example eating plan is called the DASH (Dietary Approaches to Stop Hypertension) diet. To eat this way: ? Eat plenty of fresh fruits and vegetables. Try to fill half of your plate at each meal with fruits and vegetables. ? Eat whole grains, such as whole wheat pasta, brown rice, or whole grain bread. Fill about one quarter of your plate with whole grains. ? Eat low-fat diary products. ? Avoid fatty cuts of meat, processed or cured meats, and poultry with skin. Fill about one quarter of your plate with lean proteins such as fish, chicken without skin, beans, eggs, and tofu. ? Avoid premade and processed foods. These tend to be higher in sodium, added sugar, and fat.  Reduce your daily sodium intake. Most people with hypertension should eat less than 1,500 mg of sodium a day.  Limit alcohol intake to no more than 1 drink a day for nonpregnant women and 2 drinks a day for men. One drink equals 12 oz of beer, 5 oz of wine, or 1 oz of hard liquor. Lifestyle  Work with your health care provider to maintain a healthy body weight, or to lose weight. Ask what an ideal weight is for you.  Get at least 30 minutes of exercise that causes your heart to beat faster (aerobic exercise) most days of the week. Activities may include walking, swimming, or biking.  Include exercise   to strengthen your muscles (resistance exercise), such as weight lifting, as part of your weekly exercise routine. Try to do these types of exercises for 30 minutes at least 3 days a week.  Do not use any products that contain nicotine or tobacco, such as cigarettes and e-cigarettes. If you need help quitting,  ask your health care provider.  Control any long-term (chronic) conditions you have, such as high cholesterol or diabetes. Monitoring  Monitor your blood pressure at home as told by your health care provider. Your personal target blood pressure may vary depending on your medical conditions, your age, and other factors.  Have your blood pressure checked regularly, as often as told by your health care provider. Working with your health care provider  Review all the medicines you take with your health care provider because there may be side effects or interactions.  Talk with your health care provider about your diet, exercise habits, and other lifestyle factors that may be contributing to hypertension.  Visit your health care provider regularly. Your health care provider can help you create and adjust your plan for managing hypertension. Will I need medicine to control my blood pressure? Your health care provider may prescribe medicine if lifestyle changes are not enough to get your blood pressure under control, and if:  Your systolic blood pressure is 130 or higher.  Your diastolic blood pressure is 80 or higher. Take medicines only as told by your health care provider. Follow the directions carefully. Blood pressure medicines must be taken as prescribed. The medicine does not work as well when you skip doses. Skipping doses also puts you at risk for problems. Contact a health care provider if:  You think you are having a reaction to medicines you have taken.  You have repeated (recurrent) headaches.  You feel dizzy.  You have swelling in your ankles.  You have trouble with your vision. Get help right away if:  You develop a severe headache or confusion.  You have unusual weakness or numbness, or you feel faint.  You have severe pain in your chest or abdomen.  You vomit repeatedly.  You have trouble breathing. Summary  Hypertension is when the force of blood pumping  through your arteries is too strong. If this condition is not controlled, it may put you at risk for serious complications.  Your personal target blood pressure may vary depending on your medical conditions, your age, and other factors. For most people, a normal blood pressure is less than 120/80.  Hypertension is managed by lifestyle changes, medicines, or both. Lifestyle changes include weight loss, eating a healthy, low-sodium diet, exercising more, and limiting alcohol. This information is not intended to replace advice given to you by your health care provider. Make sure you discuss any questions you have with your health care provider. Document Revised: 09/05/2018 Document Reviewed: 04/11/2016 Elsevier Patient Education  [place]. Epidermal Cyst  An epidermal cyst is a small, painless lump under your skin. The cyst contains a grayish-white, bad-smelling substance (keratin). Do not try to pop or open an epidermal cyst yourself. What are the causes?  A blocked hair follicle.  A hair that curls and re-enters the skin instead of growing straight out of the skin.  A blocked pore.  Irritated skin.  An injury to the skin.  Certain conditions that are passed along from parent to child (inherited).  Human papillomavirus (HPV).  Long-term sun damage to the skin. What increases the risk?  Having acne.  Being overweight.  Being 56-22 years old. What are the signs or symptoms? These cysts are usually harmless, but they can get infected. Symptoms of infection may include:  Redness.  Inflammation.  Tenderness.  Warmth.  Fever.  A grayish-white, bad-smelling substance drains from the cyst.  Pus drains from the cyst. How is this treated? In many cases, epidermal cysts go away on their own without treatment. If a cyst becomes infected, treatment may include:  Opening and draining the cyst, done by a doctor. After draining, you may need minor surgery to remove the  rest of the cyst.  Antibiotic medicine.  Shots of medicines (steroids) that help to reduce inflammation.  Surgery to remove the cyst. Surgery may be done if the cyst: ? Becomes large. ? Bothers you. ? Has a chance of turning into cancer.  Do not try to open a cyst yourself. Follow these instructions at home:  Take over-the-counter and prescription medicines only as told by your doctor.  If you were prescribed an antibiotic medicine, take it it as told by your doctor. Do not stop using the antibiotic even if you start to feel better.  Keep the area around your cyst clean and dry.  Wear loose, dry clothing.  Avoid touching your cyst.  Check your cyst every day for signs of infection. Check for: ? Redness, swelling, or pain. ? Fluid or blood. ? Warmth. ? Pus or a bad smell.  Keep all follow-up visits as told by your doctor. This is important. How is this prevented?  Wear clean, dry, clothing.  Avoid wearing tight clothing.  Keep your skin clean and dry. Take showers or baths every day. Contact a doctor if:  Your cyst has symptoms of infection.  Your condition does not improve or gets worse.  You have a cyst that looks different from other cysts you have had.  You have a fever. Get help right away if:  Redness spreads from the cyst into the area close by. Summary  An epidermal cyst is a sac made of skin tissue.  If a cyst becomes infected, treatment may include surgery to open and drain the cyst, or to remove it.  Take over-the-counter and prescription medicines only as told by your doctor.  Contact a doctor if your condition is not improving or is getting worse.  Keep all follow-up visits as told by your doctor. This is important. This information is not intended to replace advice given to you by your health care provider. Make sure you discuss any questions you have with your health care provider. Document Revised: 09/04/2018 Document Reviewed:  02/20/2018 Elsevier Patient Education  2020 [employer]. Uterine Fibroids  Uterine fibroids are lumps of tissue (tumors) in your womb (uterus). They are not cancer (are benign). Most women with this condition do not need treatment. Sometimes fibroids can affect your ability to have children (your fertility). If that happens, you may need surgery to take out the fibroids. Follow these instructions at home:  Take over-the-counter and prescription medicines only as told by your doctor. Your doctor may suggest NSAIDs (such as aspirin or ibuprofen) to help with pain.  Ask your doctor if you should: ? Take iron pills. ? Eat more foods that have iron in them, such as dark green, leafy vegetables.  If directed, apply heat to your back or belly to reduce pain. Use the heat source that your doctor recommends, such as a moist heat pack or a heating pad. ? Put a towel between  your skin and the heat source. ? Leave the heat on for 20-30 minutes. ? Remove the heat if your skin turns bright red. This is especially important if you are unable to feel pain, heat, or cold. You may have a greater risk of getting burned.  Pay close attention to your period (menstrual) cycles. Tell your doctor about any changes, such as: ? A heavier blood flow than usual. ? Needing to use more pads or tampons than normal. ? A change in how many days your period lasts. ? A change in symptoms that come with your period, such as cramps or back pain.  Keep all follow-up visits as told by your doctor. This is important. Your doctor may need to watch your fibroids over time for any changes. Contact a doctor if you:  Have pain that does not get better with medicine or heat, such as pain or cramps in: ? Your back. ? The area between your hip bones (pelvic area). ? Your belly.  Have new bleeding between your periods.  Have more bleeding during or between your periods.  Feel very tired or weak.  Feel light-headed. Get  help right away if you:  Pass out (faint).  Have pain in the area between your hip bones that suddenly gets worse.  Have bleeding that soaks a tampon or pad in 30 minutes or less. Summary  Uterine fibroids are lumps of tissue (tumors) in your womb (uterus). They are not cancer.  The only treatment that most women need is taking aspirin or ibuprofen for pain.  Contact a doctor if you have pain or cramps that do not get better with medicine.  Make sure you know what symptoms you should get help for right away. This information is not intended to replace advice given to you by your health care provider. Make sure you discuss any questions you have with your health care provider. Document Revised: 04/26/2017 Document Reviewed: 04/09/2017 Elsevier Patient Education  2020 [employer].

## 2019-09-10 NOTE — Progress Notes (Signed)
New Patient Office Visit  Subjective:  Patient ID: Donna Bradshaw, female    DOB: [birth year]  Age: 34 y.o. MRN: [id number]  CC:  Chief Complaint  Patient presents with  . New Patient (Initial Visit)    Est care  . Abdominal Pain    upper right side & left groin pain    HPI Donna Bradshaw presents to establish care.  She is also having abdominal pain. She  has a past medical history of Hyperthyroidism, Migraine, and Uterine fibroid.  She has had an change in her insurance.  Hypertension Patient is here for evaluation of elevated blood pressures. Age at onset of elevated blood pressure: Unknown.  Cardiac symptoms: none. Patient denies chest pain, dyspnea, irregular heart beat, lower extremity edema, near-syncope and syncope. Cardiovascular risk factors: obesity (BMI >= 30 kg/m2) and sedentary lifestyle. Use of agents associated with hypertension: NSAIDS. History of target organ damage: none. Her home blood pressure range Bp 120-138/  82-95 range.  She is currently on propanolol ER 120 mg daily for suppression and treatment of migraines.  She admits that she is not really sure how effective this is.  She does work second shift.  She is concerned that her blood pressure is not as controlled.   Keyshawna Bradshaw is a 34 y.o. female who presents for evaluation of abdominal pain. Onset was a few months ago. Symptoms have been unchanged. The pain is described as aching, and is 0/10 in intensity. Pain is located in the RUQ and LLQ  And there is radiating on the left side into the groin area.  Aggravating factors: menses and possible with bowel .  Alleviating factors: none. Associated symptoms: none. The patient denies anorexia, arthralagias, belching, chills, constipation, diarrhea, dysuria, fever, flatus, hematochezia, hematuria, myalgias, nausea and vomiting. She feels like its worse with menstrual cycle. She has as history of fibroids. She is staus post myomectomy in 02/2017. This was left side pain.  She admits that she has had reoccurrence of the fibroids . Abdominal CT scan in 2019.    Past Medical History:  Diagnosis Date  . Hyperthyroidism    no longer an issue  . Migraine   . Uterine fibroid     Past Surgical History:  Procedure Laterality Date  . MYOMECTOMY N/A 03/05/2017   Procedure: MYOMECTOMY, ABDOMINAL;  Surgeon: Linda Hedges, DO;  Location: [facility];  Service: Gynecology;  Laterality: N/A;  . NO PAST SURGERIES    . WISDOM TOOTH EXTRACTION      Family History  Problem Relation Age of Onset  . Diabetes Maternal Uncle   . Diabetes Maternal Grandmother   . Cancer Other   . Seizures Other   . Breast cancer Paternal Grandmother   . Migraines Neg Hx     Social History   Socioeconomic History  . Marital status: Single    Spouse name: Not on file  . Number of children: 0  . Years of education: 12+  . Highest education level: Not on file  Occupational History  . Occupation: Occupational psychologist  Tobacco Use  . Smoking status: Never Smoker  . Smokeless tobacco: Never Used  Substance and Sexual Activity  . Alcohol use: Yes    Alcohol/week: 0.0 standard drinks    Comment: occ  . Drug use: No  . Sexual activity: Yes    Birth control/protection: None  Other Topics Concern  . Not on file  Social History Narrative   Lives w/ sister.   Caffeine use: 1-2 times  per week   Right handed   Social Determinants of Health   Financial Resource Strain:   . Difficulty of Paying Living Expenses:   Food Insecurity:   . Worried About Charity fundraiser in the Last Year:   . Arboriculturist in the Last Year:   Transportation Needs:   . Film/video editor (Medical):   Marland Kitchen Lack of Transportation (Non-Medical):   Physical Activity:   . Days of Exercise per Week:   . Minutes of Exercise per Session:   Stress:   . Feeling of Stress :   Social Connections:   . Frequency of Communication with Friends and Family:   . Frequency of Social Gatherings with Friends and Family:     . Attends Religious Services:   . Active Member of Clubs or Organizations:   . Attends Archivist Meetings:   Marland Kitchen Marital Status:   Intimate Partner Violence:   . Fear of Current or Ex-Partner:   . Emotionally Abused:   Marland Kitchen Physically Abused:   . Sexually Abused:     ROS Review of Systems  Constitutional: Negative.   HENT: Negative.   Eyes: Negative.   Respiratory: Negative.   Cardiovascular: Negative.   Skin:       Node under her left arm and     Objective:   Today's Vitals: BP 133/81   Pulse 62   Temp 98.2 F (36.8 C)   Ht 5\' 4"  (1.626 m)   Wt 210 lb 12.8 oz (95.6 kg)   LMP 08/30/2019   BMI 36.18 kg/m   Physical Exam Constitutional:      General: She is not in acute distress.    Appearance: She is obese.  HENT:     Head: Normocephalic and atraumatic.  Eyes:     Extraocular Movements: Extraocular movements intact.  Cardiovascular:     Rate and Rhythm: Normal rate and regular rhythm.     Heart sounds: Normal heart sounds.  Pulmonary:     Effort: Pulmonary effort is normal.     Breath sounds: Normal breath sounds.  Abdominal:     General: Bowel sounds are normal.     Palpations: Abdomen is soft.  Skin:    General: Skin is warm and dry.       Neurological:     General: No focal deficit present.     Mental Status: She is alert and oriented to person, place, and time.  Psychiatric:        Mood and Affect: Mood normal.        Behavior: Behavior normal.     Assessment & Plan:   Problem List Items Addressed This Visit      Unprioritized   Benign essential HTN   Relevant Medications   propranolol ER (INDERAL LA) 160 MG SR capsule   Migraine without aura and without status migrainosus, not intractable   Relevant Medications   propranolol ER (INDERAL LA) 160 MG SR capsule   S/P myomectomy    Other Visit Diagnoses    Health care maintenance    -  Primary   Relevant Orders   POCT urinalysis dipstick (Completed)   POCT glycosylated  hemoglobin (Hb A1C) (Completed)   POCT glucose (manual entry) (Completed)   CBC with Differential/Platelet   Comp. Metabolic Panel (12)   Lipid panel   TSH   Lipase   Amylase   Vitamin B12   VITAMIN D 25 Hydroxy (Vit-D Deficiency, Fractures)   Generalized  abdominal pain       Right upper quadrant and left lower quadrant      Reassured patient that symptoms are almost certainly benign and self-resolving. Further follow-up plans will be based on outcome of lab/imaging studies; see orders.   Outpatient Encounter Medications as of 09/10/2019  Medication Sig  . cyclobenzaprine (FLEXERIL) 10 MG tablet Take 1 tablet (10 mg total) by mouth 3 (three) times daily as needed for muscle spasms. And headaches.  Marland Kitchen ibuprofen (ADVIL,MOTRIN) 200 MG tablet Take 600-800 mg by mouth as needed (for cramps).  . Multiple Vitamin (MULTIVITAMIN) capsule Take by mouth.  . naproxen sodium (ALEVE) 220 MG tablet Take 440 mg by mouth as needed.  . naratriptan (AMERGE) 2.5 MG tablet TAKE 1 TABLET BY MOUTH AT ONSET OF HEADACHE; MAY REPEAT ONCE IN 2 HOURS. AS NEEDED FOR MIGRAINE.  Marland Kitchen triamcinolone cream (KENALOG) 0.5 % APPLY CREAM TOPICALLY TO AFFECTED AREA 4 TIMES DAILY  . [DISCONTINUED] propranolol ER (INDERAL LA) 120 MG 24 hr capsule Take 1 capsule (120 mg total) by mouth at bedtime. Please keep pending appointment for further refills.  . propranolol ER (INDERAL LA) 160 MG SR capsule Take 1 capsule (160 mg total) by mouth daily.   No facility-administered encounter medications on file as of 09/10/2019.    Follow-up: Return in about 2 months (around 11/10/2019).   Vevelyn Francois, NP

## 2019-09-11 LAB — COMP. METABOLIC PANEL (12)
AST: 18 IU/L (ref 0–40)
Albumin/Globulin Ratio: 1.7 (ref 1.2–2.2)
Albumin: 4.3 g/dL (ref 3.8–4.8)
Alkaline Phosphatase: 57 IU/L (ref 39–117)
BUN/Creatinine Ratio: 9 (ref 9–23)
BUN: 8 mg/dL (ref 6–20)
Bilirubin Total: 0.6 mg/dL (ref 0.0–1.2)
Calcium: 9.2 mg/dL (ref 8.7–10.2)
Chloride: 103 mmol/L (ref 96–106)
Creatinine, Ser: 0.94 mg/dL (ref 0.57–1.00)
GFR calc Af Amer: 91 mL/min/{1.73_m2} (ref >59)
GFR calc non Af Amer: 79 mL/min/{1.73_m2} (ref >59)
Globulin, Total: 2.6 g/dL (ref 1.5–4.5)
Glucose: 96 mg/dL (ref 65–99)
Potassium: 4.1 mmol/L (ref 3.5–5.2)
Sodium: 138 mmol/L (ref 134–144)
Total Protein: 6.9 g/dL (ref 6.0–8.5)

## 2019-09-11 LAB — CBC WITH DIFFERENTIAL/PLATELET
Basophils Absolute: 0 10*3/uL (ref 0.0–0.2)
Basos: 0 %
EOS (ABSOLUTE): 0.1 10*3/uL (ref 0.0–0.4)
Eos: 2 %
Hematocrit: 37.7 % (ref 34.0–46.6)
Hemoglobin: 12.7 g/dL (ref 11.1–15.9)
Immature Grans (Abs): 0 10*3/uL (ref 0.0–0.1)
Immature Granulocytes: 0 %
Lymphocytes Absolute: 2.5 10*3/uL (ref 0.7–3.1)
Lymphs: 40 %
MCH: 29.5 pg (ref 26.6–33.0)
MCHC: 33.7 g/dL (ref 31.5–35.7)
MCV: 88 fL (ref 79–97)
Monocytes Absolute: 0.5 10*3/uL (ref 0.1–0.9)
Monocytes: 8 %
Neutrophils Absolute: 3.1 10*3/uL (ref 1.4–7.0)
Neutrophils: 50 %
Platelets: 290 10*3/uL (ref 150–450)
RBC: 4.31 x10E6/uL (ref 3.77–5.28)
RDW: 12.6 % (ref 11.7–15.4)
WBC: 6.3 10*3/uL (ref 3.4–10.8)

## 2019-09-11 LAB — LIPASE: Lipase: 45 U/L (ref 14–72)

## 2019-09-11 LAB — LIPID PANEL
Chol/HDL Ratio: 2.6 ratio (ref 0.0–4.4)
Cholesterol, Total: 132 mg/dL (ref 100–199)
HDL: 51 mg/dL (ref >39)
LDL Chol Calc (NIH): 70 mg/dL (ref 0–99)
Triglycerides: 47 mg/dL (ref 0–149)
VLDL Cholesterol Cal: 11 mg/dL (ref 5–40)

## 2019-09-11 LAB — VITAMIN D 25 HYDROXY (VIT D DEFICIENCY, FRACTURES): Vit D, 25-Hydroxy: 24.4 ng/mL — ABNORMAL LOW (ref 30.0–100.0)

## 2019-09-11 LAB — TSH: TSH: 8.14 u[IU]/mL — ABNORMAL HIGH (ref 0.450–4.500)

## 2019-09-11 LAB — AMYLASE: Amylase: 67 U/L (ref 31–110)

## 2019-09-11 LAB — VITAMIN B12: Vitamin B-12: 586 pg/mL (ref 232–1245)

## 2019-09-14 ENCOUNTER — Encounter: Payer: Self-pay | Admitting: Nurse Practitioner

## 2019-09-14 ENCOUNTER — Other Ambulatory Visit: Payer: Self-pay | Admitting: Nurse Practitioner

## 2019-09-14 DIAGNOSIS — E559 Vitamin D deficiency, unspecified: Secondary | ICD-10-CM | POA: Insufficient documentation

## 2019-09-14 DIAGNOSIS — R7989 Other specified abnormal findings of blood chemistry: Secondary | ICD-10-CM | POA: Insufficient documentation

## 2019-09-14 NOTE — Addendum Note (Signed)
Addended by: Vevelyn Francois on: 09/14/2019 02:45 PM   Modules accepted: Orders

## 2019-09-16 LAB — T3: T3, Total: 118 ng/dL (ref 71–180)

## 2019-09-16 LAB — T4, FREE: Free T4: 1.22 ng/dL (ref 0.82–1.77)

## 2019-09-16 LAB — SPECIMEN STATUS REPORT

## 2019-11-11 ENCOUNTER — Ambulatory Visit: Payer: BC Managed Care – PPO | Admitting: Nurse Practitioner

## 2019-11-12 ENCOUNTER — Other Ambulatory Visit: Payer: Self-pay

## 2019-11-12 ENCOUNTER — Ambulatory Visit (INDEPENDENT_AMBULATORY_CARE_PROVIDER_SITE_OTHER): Payer: BC Managed Care – PPO | Admitting: Nurse Practitioner

## 2019-11-12 ENCOUNTER — Encounter: Payer: Self-pay | Admitting: Nurse Practitioner

## 2019-11-12 ENCOUNTER — Encounter: Payer: Self-pay | Admitting: Gastroenterology

## 2019-11-12 ENCOUNTER — Other Ambulatory Visit: Payer: Self-pay | Admitting: Nurse Practitioner

## 2019-11-12 VITALS — BP 135/84 | HR 70 | Temp 98.1°F | Ht 61.0 in | Wt 212.0 lb

## 2019-11-12 DIAGNOSIS — I10 Essential (primary) hypertension: Secondary | ICD-10-CM

## 2019-11-12 DIAGNOSIS — R1011 Right upper quadrant pain: Secondary | ICD-10-CM

## 2019-11-12 DIAGNOSIS — G43009 Migraine without aura, not intractable, without status migrainosus: Secondary | ICD-10-CM

## 2019-11-12 DIAGNOSIS — R7989 Other specified abnormal findings of blood chemistry: Secondary | ICD-10-CM

## 2019-11-12 NOTE — Progress Notes (Signed)
Donna Bradshaw, [name]  [id number] Phone:  [phone]   Fax:  [phone]   Established Patient Office Visit  Subjective:  Patient ID: Donna Bradshaw, female    DOB: 06/30/1985  Age: 34 y.o. MRN: [id number]  CC:  Chief Complaint  Patient presents with  . Follow-up    2 follow up , wants discuss getting a referral to gi  for ruq pain that has been going on since oct.     HPI Donna Bradshaw presents for follow up. She  has a past medical history of Hyperthyroidism, Migraine, and Uterine fibroid.   Hypertension Patient is here for follow-up of elevated blood pressure. She is notis not exercising and is not adherent to a low-salt diet. Blood pressure is varying at home.BP ranges  116-147/77-99. Cardiac symptoms: nonenonenone. Patient denies chest pain, dyspnea, fatigue, lower extremity edema, palpitations and syncopechest pain, dyspnea, fatigue, lower extremity edema, palpitations and syncope. Cardiovascular risk factors: none. Use of agents associated with hypertension: NSAIDS. History of target organ damage: none.  Denies dizziness, visual changes, shortness of breath, dyspnea on exertion, chest pain, nausea, vomiting or any edema. She does have migraines. She has noted 2or 3 over the last 6 weeks. She also admits that they are hormonal in nature. She did have some elevated BP's during this time.    Abdominal Pain Patient complains of abdominal pain. The pain is described as aching, and is 6/10 in intensity which is worse with cycles but is a 1/10 constantly. The patient is experiencing RUQ pain with radiation to right back and right groin. Onset was 1 months ago. Symptoms have been unchanged. Aggravating factors: menstrual cycle.  Alleviating factors: none. Associated symptoms: headache. The patient denies anorexia, belching, chills, constipation, diarrhea, dysuria, fever, flatus, frequency, hematochezia, hematuria, melena, nausea and vomiting. She  has not had her PAP in the last year. She feels like her last pap was 2019 and it was normal.   Past Medical History:  Diagnosis Date  . Hyperthyroidism    no longer an issue  . Migraine   . Uterine fibroid     Past Surgical History:  Procedure Laterality Date  . MYOMECTOMY N/A 03/05/2017   Procedure: MYOMECTOMY, ABDOMINAL;  Surgeon: Donna Hedges, DO;  Location: [facility];  Service: Gynecology;  Laterality: N/A;  . NO PAST SURGERIES    . WISDOM TOOTH EXTRACTION      Family History  Problem Relation Age of Onset  . Diabetes Maternal Uncle   . Diabetes Maternal Grandmother   . Cancer Other   . Seizures Other   . Breast cancer Paternal Grandmother   . Migraines Neg Hx     Social History   Socioeconomic History  . Marital status: Single    Spouse name: Not on file  . Number of children: 0  . Years of education: 12+  . Highest education level: Not on file  Occupational History  . Occupation: Occupational psychologist  Tobacco Use  . Smoking status: Never Smoker  . Smokeless tobacco: Never Used  Vaping Use  . Vaping Use: Never used  Substance and Sexual Activity  . Alcohol use: Yes    Alcohol/week: 0.0 standard drinks    Comment: occ  . Drug use: No  . Sexual activity: Yes    Birth control/protection: None  Other Topics Concern  . Not on file  Social History Narrative   Lives w/ sister.   Caffeine use: 1-2 times per week  Right handed   Social Determinants of Health   Financial Resource Strain:   . Difficulty of Paying Living Expenses:   Food Insecurity:   . Worried About Charity fundraiser in the Last Year:   . Arboriculturist in the Last Year:   Transportation Needs:   . Film/video editor (Medical):   Marland Kitchen Lack of Transportation (Non-Medical):   Physical Activity:   . Days of Exercise per Week:   . Minutes of Exercise per Session:   Stress:   . Feeling of Stress :   Social Connections:   . Frequency of Communication with Friends and Family:   . Frequency of  Social Gatherings with Friends and Family:   . Attends Religious Services:   . Active Member of Clubs or Organizations:   . Attends Archivist Meetings:   Marland Kitchen Marital Status:   Intimate Partner Violence:   . Fear of Current or Ex-Partner:   . Emotionally Abused:   Marland Kitchen Physically Abused:   . Sexually Abused:     Outpatient Medications Prior to Visit  Medication Sig Dispense Refill  . cyclobenzaprine (FLEXERIL) 10 MG tablet Take 1 tablet (10 mg total) by mouth 3 (three) times daily as needed for muscle spasms. And headaches. 90 tablet 6  . ibuprofen (ADVIL,MOTRIN) 200 MG tablet Take 600-800 mg by mouth as needed (for cramps).    . Multiple Vitamin (MULTIVITAMIN) capsule Take by mouth.    . naproxen sodium (ALEVE) 220 MG tablet Take 440 mg by mouth as needed.    . naratriptan (AMERGE) 2.5 MG tablet TAKE 1 TABLET BY MOUTH AT ONSET OF HEADACHE; MAY REPEAT ONCE IN 2 HOURS. AS NEEDED FOR MIGRAINE. 9 tablet 11  . triamcinolone cream (KENALOG) 0.5 % APPLY CREAM TOPICALLY TO AFFECTED AREA 4 TIMES DAILY    . Vitamin D, Cholecalciferol, 25 MCG (1000 UT) TABS Take by mouth. 2,000 units daily    . propranolol ER (INDERAL LA) 160 MG SR capsule Take 1 capsule (160 mg total) by mouth daily. 30 capsule 1   No facility-administered medications prior to visit.    No Known Allergies  ROS Review of Systems  Neurological: Positive for headaches.  All other systems reviewed and are negative.     Objective:    Physical Exam Constitutional:      General: She is not in acute distress.    Appearance: She is obese. She is not ill-appearing or toxic-appearing.  HENT:     Head: Normocephalic and atraumatic.  Cardiovascular:     Rate and Rhythm: Normal rate and regular rhythm.     Pulses: Normal pulses.     Heart sounds: Normal heart sounds.  Pulmonary:     Effort: Pulmonary effort is normal.     Breath sounds: Normal breath sounds.  Abdominal:     Palpations: Abdomen is soft.    Musculoskeletal:        General: Normal range of motion.     Cervical back: Normal range of motion and neck supple.  Skin:    General: Skin is warm and dry.  Neurological:     Mental Status: She is alert and oriented to person, place, and time.  Psychiatric:        Mood and Affect: Mood normal.        Behavior: Behavior normal.        Thought Content: Thought content normal.        Judgment: Judgment normal.  BP 135/84 (BP Location: Left Arm, Patient Position: Sitting)   Pulse 70   Temp 98.1 F (36.7 C) (Temporal)   Ht 5\' 1"  (1.549 m)   Wt 212 lb (96.2 kg)   LMP 10/25/2019   SpO2 100%   BMI 40.06 kg/m  Wt Readings from Last 3 Encounters:  11/12/19 212 lb (96.2 kg)  09/10/19 210 lb 12.8 oz (95.6 kg)  02/17/19 214 lb (97.1 kg)     Health Maintenance Due  Topic Date Due  . Hepatitis C Screening  Never done  . HIV Screening  Never done  . TETANUS/TDAP  Never done  . PAP SMEAR-Modifier  03/24/2018    There are no preventive care reminders to display for this patient.  Lab Results  Component Value Date   TSH 4.270 11/12/2019   Lab Results  Component Value Date   WBC 6.3 09/10/2019   HGB 12.7 09/10/2019   HCT 37.7 09/10/2019   MCV 88 09/10/2019   PLT 290 09/10/2019   Lab Results  Component Value Date   NA 138 09/10/2019   K 4.1 09/10/2019   CO2 24 02/25/2017   GLUCOSE 96 09/10/2019   BUN 8 09/10/2019   CREATININE 0.94 09/10/2019   BILITOT 0.6 09/10/2019   ALKPHOS 57 09/10/2019   AST 18 09/10/2019   ALT 17 02/25/2017   PROT 6.9 09/10/2019   ALBUMIN 4.3 09/10/2019   CALCIUM 9.2 09/10/2019   ANIONGAP 6 02/25/2017   Lab Results  Component Value Date   CHOL 132 09/10/2019   Lab Results  Component Value Date   HDL 51 09/10/2019   Lab Results  Component Value Date   LDLCALC 70 09/10/2019   Lab Results  Component Value Date   TRIG 47 09/10/2019   Lab Results  Component Value Date   CHOLHDL 2.6 09/10/2019   Lab Results  Component Value  Date   HGBA1C 5.4 09/10/2019      Assessment & Plan:   Problem List Items Addressed This Visit      Cardiovascular and Mediastinum   Benign essential HTN We will continue with propanolol 160 mg daily Encourage lifestyle modification with strict diet and weight loss. Continue to monitor blood pressure if patient continues to see diastolic blood pressures above 90 we will consider adding a second agent for better control   Migraine without aura and without status migrainosus, not intractable Encourage patient to try to avoid overusing naproxen because of the potential to increase blood pressure and to consider Excedrin Migraine without aspirin     Other   Abnormal TSH - Primary   Relevant Orders   TSH (Completed)  repeat TSH for confirmation    Other Visit Diagnoses    Right upper quadrant pain    Relevant Orders   Ambulatory referral to Gastroenterology Patient requested referral to gastroenterology.  She has been under the care of gastroenterology in the past. Patient encouraged to have her Pap repeated along with vaginal ultrasound which may help explain some of the abdominal pain      No orders of the defined types were placed in this encounter.   Follow-up: Return in 2 months (on 01/24/2020).    Vevelyn Francois, NP

## 2019-11-12 NOTE — Patient Instructions (Addendum)
Abdominal Pain, Adult Pain in the abdomen (abdominal pain) can be caused by many things. Often, abdominal pain is not serious and it gets better with no treatment or by being treated at home. However, sometimes abdominal pain is serious. Your health care provider will ask questions about your medical history and do a physical exam to try to determine the cause of your abdominal pain. Follow these instructions at home:  Medicines  Take over-the-counter and prescription medicines only as told by your health care provider.  Do not take a laxative unless told by your health care provider. General instructions  Watch your condition for any changes.  Drink enough fluid to keep your urine pale yellow.  Keep all follow-up visits as told by your health care provider. This is important. Contact a health care provider if:  Your abdominal pain changes or gets worse.  You are not hungry or you lose weight without trying.  You are constipated or have diarrhea for more than 2-3 days.  You have pain when you urinate or have a bowel movement.  Your abdominal pain wakes you up at night.  Your pain gets worse with meals, after eating, or with certain foods.  You are vomiting and cannot keep anything down.  You have a fever.  You have blood in your urine. Get help right away if:  Your pain does not go away as soon as your health care provider told you to expect.  You cannot stop vomiting.  Your pain is only in areas of the abdomen, such as the right side or the left lower portion of the abdomen. Pain on the right side could be caused by appendicitis.  You have bloody or black stools, or stools that look like tar.  You have severe pain, cramping, or bloating in your abdomen.  You have signs of dehydration, such as: ? Dark urine, very little urine, or no urine. ? Cracked lips. ? Dry mouth. ? Sunken eyes. ? Sleepiness. ? Weakness.  You have trouble breathing or chest  pain. Summary  Often, abdominal pain is not serious and it gets better with no treatment or by being treated at home. However, sometimes abdominal pain is serious.  Watch your condition for any changes.  Take over-the-counter and prescription medicines only as told by your health care provider.  Contact a health care provider if your abdominal pain changes or gets worse.  Get help right away if you have severe pain, cramping, or bloating in your abdomen. This information is not intended to replace advice given to you by your health care provider. Make sure you discuss any questions you have with your health care provider. Document Revised: 09/22/2018 Document Reviewed: 09/22/2018 Elsevier Patient Education  2020 [employer].  Exercising to Lose Weight Exercise is structured, repetitive physical activity to improve fitness and health. Getting regular exercise is important for everyone. It is especially important if you are overweight. Being overweight increases your risk of heart disease, stroke, diabetes, high blood pressure, and several types of cancer. Reducing your calorie intake and exercising can help you lose weight. Exercise is usually categorized as moderate or vigorous intensity. To lose weight, most people need to do a certain amount of moderate-intensity or vigorous-intensity exercise each week. Moderate-intensity exercise  Moderate-intensity exercise is any activity that gets you moving enough to burn at least three times more energy (calories) than if you were sitting. Examples of moderate exercise include:  Walking a mile in 15 minutes.  Doing light yard  work.  Psychiatric nurse at an Management consultant. Most people should get at least 150 minutes (2 hours and 30 minutes) a week of moderate-intensity exercise to maintain their body weight. Vigorous-intensity exercise Vigorous-intensity exercise is any activity that gets you moving enough to burn at least six times more calories than if you  were sitting. When you exercise at this intensity, you should be working hard enough that you are not able to carry on a conversation. Examples of vigorous exercise include:  Running.  Playing a team sport, such as football, basketball, and soccer.  Jumping rope. Most people should get at least 75 minutes (1 hour and 15 minutes) a week of vigorous-intensity exercise to maintain their body weight. How can exercise affect me? When you exercise enough to burn more calories than you eat, you lose weight. Exercise also reduces body fat and builds muscle. The more muscle you have, the more calories you burn. Exercise also:  Improves mood.  Reduces stress and tension.  Improves your overall fitness, flexibility, and endurance.  Increases bone strength. The amount of exercise you need to lose weight depends on:  Your age.  The type of exercise.  Any health conditions you have.  Your overall physical ability. Talk to your health care provider about how much exercise you need and what types of activities are safe for you. What actions can I take to lose weight? Nutrition   Make changes to your diet as told by your health care provider or diet and nutrition specialist (dietitian). This may include: ? Eating fewer calories. ? Eating more protein. ? Eating less unhealthy fats. ? Eating a diet that includes fresh fruits and vegetables, whole grains, low-fat dairy products, and lean protein. ? Avoiding foods with added fat, salt, and sugar.  Drink plenty of water while you exercise to prevent dehydration or heat stroke. Activity  Choose an activity that you enjoy and set realistic goals. Your health care provider can help you make an exercise plan that works for you.  Exercise at a moderate or vigorous intensity most days of the week. ? The intensity of exercise may vary from person to person. You can tell how intense a workout is for you by paying attention to your breathing and  heartbeat. Most people will notice their breathing and heartbeat get faster with more intense exercise.  Do resistance training twice each week, such as: ? Push-ups. ? Sit-ups. ? Lifting weights. ? Using resistance bands.  Getting short amounts of exercise can be just as helpful as long structured periods of exercise. If you have trouble finding time to exercise, try to include exercise in your daily routine. ? Get up, stretch, and walk around every 30 minutes throughout the day. ? Go for a walk during your lunch break. ? Park your car farther away from your destination. ? If you take public transportation, get off one stop early and walk the rest of the way. ? Make phone calls while standing up and walking around. ? Take the stairs instead of elevators or escalators.  Wear comfortable clothes and shoes with good support.  Do not exercise so much that you hurt yourself, feel dizzy, or get very short of breath. Where to find more information  U.S. Department of Health and Human Services: [employer]at  Centers for Disease Control and Prevention (CDC): http://www.wolf.info/ Contact a health care provider:  Before starting a new exercise program.  If you have questions or concerns about your weight.  If you have a  medical problem that keeps you from exercising. Get help right away if you have any of the following while exercising:  Injury.  Dizziness.  Difficulty breathing or shortness of breath that does not go away when you stop exercising.  Chest pain.  Rapid heartbeat. Summary  Being overweight increases your risk of heart disease, stroke, diabetes, high blood pressure, and several types of cancer.  Losing weight happens when you burn more calories than you eat.  Reducing the amount of calories you eat in addition to getting regular moderate or vigorous exercise each week helps you lose weight. This information is not intended to replace advice given to you by your health care  provider. Make sure you discuss any questions you have with your health care provider. Document Revised: 05/27/2017 Document Reviewed: 05/27/2017 Elsevier Patient Education  2020 [employer].

## 2019-11-13 LAB — TSH: TSH: 4.27 u[IU]/mL (ref 0.450–4.500)

## 2020-01-01 ENCOUNTER — Ambulatory Visit (INDEPENDENT_AMBULATORY_CARE_PROVIDER_SITE_OTHER): Payer: No Typology Code available for payment source | Admitting: Gastroenterology

## 2020-01-01 ENCOUNTER — Encounter: Payer: Self-pay | Admitting: Gastroenterology

## 2020-01-01 VITALS — BP 118/80 | HR 80 | Ht 64.0 in | Wt 210.1 lb

## 2020-01-01 DIAGNOSIS — R1011 Right upper quadrant pain: Secondary | ICD-10-CM | POA: Diagnosis not present

## 2020-01-01 DIAGNOSIS — R109 Unspecified abdominal pain: Secondary | ICD-10-CM

## 2020-01-01 DIAGNOSIS — R933 Abnormal findings on diagnostic imaging of other parts of digestive tract: Secondary | ICD-10-CM | POA: Diagnosis not present

## 2020-01-01 NOTE — Progress Notes (Signed)
History of Present Illness: This is a 34 year old female referred by Vevelyn Francois, NP for the evaluation of right-sided and left-sided abdominal pain.  She relates left upper quadrant pain that radiates to the left flank and left lower quadrant intermittently for about 2 years.  This is not associated with bowel movements meals or movement or bending.  Over the past year she has noted right upper quadrant pain that radiates to the right flank.  The symptoms do not change with meals but are often changed with movement and bending.  She describes both pains are clearly exacerbated during her menstrual cycles.  Prior CT AP as below.  Follow-up CT or MR imaging was not performed.  She relates episodes of intermittent nausea with occasional vomiting brought on while eating that occur about once a month.  Denies weight loss, constipation, diarrhea, change in stool caliber, melena, hematochezia, dysphagia, reflux symptoms, chest pain.  CT AP 01/16/2018 1. Left ovarian dominant follicle or cyst. Otherwise, no explanation for left-sided pain 2. Enlarged appendix with suspicion of mild appendiceal hyperenhancement. Correlate with any symptoms to suggest acute appendicitis. If there are no such symptoms, potential clinical strategies include CT or MRI follow-up at 3 months versus a more aggressive approach with colonoscopy and attention to the appendiceal orifice (to exclude unlikely mucocele). 3. Small low-density structure within the inferior right abdominal omentum is of indeterminate etiology. Presuming no history of primary malignancy, most likely an incidental focus of contained fluid or an implant in the setting of endometriosis. 4.  Trace free pelvic fluid is likely physiologic.   No Known Allergies Outpatient Medications Prior to Visit  Medication Sig Dispense Refill  . cyclobenzaprine (FLEXERIL) 10 MG tablet Take 1 tablet (10 mg total) by mouth 3 (three) times daily as needed for muscle spasms.  And headaches. 90 tablet 6  . ibuprofen (ADVIL,MOTRIN) 200 MG tablet Take 600-800 mg by mouth as needed (for cramps).    . Multiple Vitamin (MULTIVITAMIN) capsule Take by mouth.    . naproxen sodium (ALEVE) 220 MG tablet Take 440 mg by mouth as needed.    . naratriptan (AMERGE) 2.5 MG tablet TAKE 1 TABLET BY MOUTH AT ONSET OF HEADACHE; MAY REPEAT ONCE IN 2 HOURS. AS NEEDED FOR MIGRAINE. 9 tablet 11  . propranolol ER (INDERAL LA) 160 MG SR capsule Take 1 capsule by mouth once daily 30 capsule 3  . triamcinolone cream (KENALOG) 0.5 % APPLY CREAM TOPICALLY TO AFFECTED AREA 4 TIMES DAILY    . Vitamin D, Cholecalciferol, 25 MCG (1000 UT) TABS Take by mouth. 2,000 units daily     No facility-administered medications prior to visit.   Past Medical History:  Diagnosis Date  . Hyperthyroidism    no longer an issue  . Migraine   . Uterine fibroid    Past Surgical History:  Procedure Laterality Date  . MYOMECTOMY N/A 03/05/2017   Procedure: MYOMECTOMY, ABDOMINAL;  Surgeon: Linda Hedges, DO;  Location: [facility];  Service: Gynecology;  Laterality: N/A;  . NO PAST SURGERIES    . WISDOM TOOTH EXTRACTION     Social History   Socioeconomic History  . Marital status: Single    Spouse name: Not on file  . Number of children: 0  . Years of education: 12+  . Highest education level: Not on file  Occupational History  . Occupation: Occupational psychologist  Tobacco Use  . Smoking status: Never Smoker  . Smokeless tobacco: Never Used  Vaping Use  .  Vaping Use: Never used  Substance and Sexual Activity  . Alcohol use: Yes    Alcohol/week: 0.0 standard drinks    Comment: occ  . Drug use: No  . Sexual activity: Yes    Birth control/protection: None  Other Topics Concern  . Not on file  Social History Narrative   Lives w/ sister.   Caffeine use: 1-2 times per week   Right handed   Social Determinants of Health   Financial Resource Strain:   . Difficulty of Paying Living Expenses:   Food Insecurity:    . Worried About Charity fundraiser in the Last Year:   . Arboriculturist in the Last Year:   Transportation Needs:   . Film/video editor (Medical):   Marland Kitchen Lack of Transportation (Non-Medical):   Physical Activity:   . Days of Exercise per Week:   . Minutes of Exercise per Session:   Stress:   . Feeling of Stress :   Social Connections:   . Frequency of Communication with Friends and Family:   . Frequency of Social Gatherings with Friends and Family:   . Attends Religious Services:   . Active Member of Clubs or Organizations:   . Attends Archivist Meetings:   Marland Kitchen Marital Status:    Family History  Problem Relation Age of Onset  . Diabetes Maternal Uncle   . Diabetes Maternal Grandmother   . Cancer Other   . Seizures Other   . Breast cancer Paternal Grandmother   . Migraines Neg Hx   . Colon cancer Neg Hx   . Stomach cancer Neg Hx   . Pancreatic cancer Neg Hx   . Esophageal cancer Neg Hx       Review of Systems: Pertinent positive and negative review of systems were noted in the above HPI section. All other review of systems were otherwise negative.   Physical Exam: General: Well developed, well nourished, no acute distress Head: Normocephalic and atraumatic Eyes:  sclerae anicteric, EOMI Ears: Normal auditory acuity Mouth: Not examined, mask on during Covid-19 pandemic Neck: Supple, no masses or thyromegaly Lungs: Clear throughout to auscultation Heart: Regular rate and rhythm; no murmurs, rubs or bruits Abdomen: Soft, non tender and non distended. No masses, hepatosplenomegaly or hernias noted. Normal Bowel sounds Rectal: Not done Musculoskeletal: Symmetrical with no gross deformities  Skin: No lesions on visible extremities Pulses:  Normal pulses noted Extremities: No clubbing, cyanosis, edema or deformities noted Neurological: Alert oriented x 4, grossly nonfocal Cervical Nodes:  No significant cervical adenopathy Inguinal Nodes: No significant  inguinal adenopathy Psychological:  Alert and cooperative. Normal mood and affect   Assessment and Recommendations:  1.  Right upper quadrant pain, right flank pain, left-sided abdominal pain and left flank pain.  Symptoms are significantly exacerbated during menstrual cycles.  Consider endometriosis, musculoskeletal symptoms.  No clear gastrointestinal etiology.  Trial of IBgard 1-2 p.o. 3 times daily for 2 weeks, if not effective the Prilosec OTC 20 mg daily for 2 weeks, if not effective trial of a daily probiotic.  Recommend GYN evaluation.  2.  Lactose intolerance.  Avoid lactose and/or use Lactaid pills.  3.  Abnormal CT of the appendix and right sided omentum.  Repeat CT AP for further evaluation.   cc: Vevelyn Francois, NP [address] [place],  [place] [address]

## 2020-01-01 NOTE — Patient Instructions (Signed)
Take Ibgard more often for abdominal pain. If this does not help then take over the counter Prilosec daily for 2 weeks.  You have been scheduled for a CT scan of the abdomen and pelvis at [facility] ([address]---this is in the same building as [employer]).   You are scheduled on 01/07/20 at 4:30pm. You should arrive 15 minutes prior to your appointment time for registration. Please follow the written instructions below on the day of your exam:  WARNING: IF YOU ARE ALLERGIC TO IODINE/X-RAY DYE, PLEASE NOTIFY RADIOLOGY IMMEDIATELY AT [phone]! YOU WILL BE GIVEN A 13 HOUR PREMEDICATION PREP.  1) Do not eat or drink anything after 12:30pm (4 hours prior to your test) 2) You have been given 2 bottles of oral contrast to drink. The solution may taste better if refrigerated, but do NOT add ice or any other liquid to this solution. Shake well before drinking.    Drink 1 bottle of contrast @ 2:30pm (2 hours prior to your exam)  Drink 1 bottle of contrast @ 3:30pm (1 hour prior to your exam)  You may take any medications as prescribed with a small amount of water, if necessary. If you take any of the following medications: METFORMIN, GLUCOPHAGE, GLUCOVANCE, AVANDAMET, RIOMET, FORTAMET, [place] MET, JANUMET, GLUMETZA or METAGLIP, you MAY be asked to HOLD this medication 48 hours AFTER the exam.  The purpose of you drinking the oral contrast is to aid in the visualization of your intestinal tract. The contrast solution may cause some diarrhea. Depending on your individual set of symptoms, you may also receive an intravenous injection of x-ray contrast/dye. Plan on being at [facility] for 30 minutes or longer, depending on the type of exam you are having performed.  This test typically takes 30-45 minutes to complete.  If you have any questions regarding your exam or if you need to reschedule, you may call the CT department at [phone] between the hours of 8:00  am and 5:00 pm, Monday-Friday.  ____________________________________________________________  Thank you for choosing me and [facility].  Pricilla Riffle. Dagoberto Ligas., MD., Marval Regal

## 2020-01-07 ENCOUNTER — Ambulatory Visit (INDEPENDENT_AMBULATORY_CARE_PROVIDER_SITE_OTHER)
Admission: RE | Admit: 2020-01-07 | Discharge: 2020-01-07 | Disposition: A | Discharge status: Home / Self Care | Payer: No Typology Code available for payment source | Source: Ambulatory Visit | Attending: Gastroenterology | Admitting: Gastroenterology

## 2020-01-07 DIAGNOSIS — R1011 Right upper quadrant pain: Secondary | ICD-10-CM | POA: Diagnosis not present

## 2020-01-07 MED ORDER — IOHEXOL 300 MG/ML  SOLN
100.0000 mL | Freq: Once | INTRAMUSCULAR | Status: AC | PRN
  Administered 2020-01-07: 100 mL via INTRAVENOUS

## 2020-01-13 ENCOUNTER — Encounter: Payer: Self-pay | Admitting: Nurse Practitioner

## 2020-01-13 ENCOUNTER — Ambulatory Visit (INDEPENDENT_AMBULATORY_CARE_PROVIDER_SITE_OTHER): Payer: No Typology Code available for payment source | Admitting: Nurse Practitioner

## 2020-01-13 ENCOUNTER — Other Ambulatory Visit: Payer: Self-pay

## 2020-01-13 VITALS — BP 132/84 | HR 70 | Temp 97.7°F | Ht 64.0 in | Wt 210.0 lb

## 2020-01-13 DIAGNOSIS — E0590 Thyrotoxicosis, unspecified without thyrotoxic crisis or storm: Secondary | ICD-10-CM | POA: Diagnosis not present

## 2020-01-13 DIAGNOSIS — G8929 Other chronic pain: Secondary | ICD-10-CM

## 2020-01-13 DIAGNOSIS — Z114 Encounter for screening for human immunodeficiency virus [HIV]: Secondary | ICD-10-CM | POA: Diagnosis not present

## 2020-01-13 DIAGNOSIS — R1031 Right lower quadrant pain: Secondary | ICD-10-CM

## 2020-01-13 DIAGNOSIS — I10 Essential (primary) hypertension: Secondary | ICD-10-CM | POA: Diagnosis not present

## 2020-01-13 DIAGNOSIS — Z1159 Encounter for screening for other viral diseases: Secondary | ICD-10-CM

## 2020-01-13 NOTE — Progress Notes (Signed)
[place] [place], [place]  [address] Phone:  [phone]   Fax:  [phone]   Established Patient Office Visit  Subjective:  Patient ID: Donna Bradshaw, female    DOB: 08/27/1985  Age: 34 y.o. MRN: [id number]  CC:  Chief Complaint  Patient presents with  . Follow-up    HPI Saryiah Bencosme presents for follow up. She  has a past medical history of Hyperthyroidism, Migraine, and Uterine fibroid.   Hypertension Patient is here for follow-up of elevated blood pressure. She is not exercising and is adherent to a low-salt diet. Blood pressure is well controlled at home. Cardiac symptoms: none. Patient denies chest pain, chest pressure/discomfort, dyspnea, irregular heart beat, lower extremity edema, palpitations and syncope. Cardiovascular risk factors: none. Use of agents associated with hypertension: NSAIDS. History of target organ damage: none.  Right abd ache pain with bending and  She is wearing binder  Past Medical History:  Diagnosis Date  . Hyperthyroidism    no longer an issue  . Migraine   . Uterine fibroid     Past Surgical History:  Procedure Laterality Date  . MYOMECTOMY N/A 03/05/2017   Procedure: MYOMECTOMY, ABDOMINAL;  Surgeon: Linda Hedges, DO;  Location: [facility];  Service: Gynecology;  Laterality: N/A;  . NO PAST SURGERIES    . WISDOM TOOTH EXTRACTION      Family History  Problem Relation Age of Onset  . Diabetes Maternal Uncle   . Diabetes Maternal Grandmother   . Cancer Other   . Seizures Other   . Breast cancer Paternal Grandmother   . Migraines Neg Hx   . Colon cancer Neg Hx   . Stomach cancer Neg Hx   . Pancreatic cancer Neg Hx   . Esophageal cancer Neg Hx     Social History   Socioeconomic History  . Marital status: Single    Spouse name: Not on file  . Number of children: 0  . Years of education: 12+  . Highest education level: Not on file  Occupational History  . Occupation: Occupational psychologist    Tobacco Use  . Smoking status: Never Smoker  . Smokeless tobacco: Never Used  Vaping Use  . Vaping Use: Never used  Substance and Sexual Activity  . Alcohol use: Yes    Alcohol/week: 0.0 standard drinks    Comment: occ  . Drug use: No  . Sexual activity: Yes    Birth control/protection: None  Other Topics Concern  . Not on file  Social History Narrative   Lives w/ sister.   Caffeine use: 1-2 times per week   Right handed   Social Determinants of Health   Financial Resource Strain:   . Difficulty of Paying Living Expenses: Not on file  Food Insecurity:   . Worried About Charity fundraiser in the Last Year: Not on file  . Ran Out of Food in the Last Year: Not on file  Transportation Needs:   . Lack of Transportation (Medical): Not on file  . Lack of Transportation (Non-Medical): Not on file  Physical Activity:   . Days of Exercise per Week: Not on file  . Minutes of Exercise per Session: Not on file  Stress:   . Feeling of Stress : Not on file  Social Connections:   . Frequency of Communication with Friends and Family: Not on file  . Frequency of Social Gatherings with Friends and Family: Not on file  . Attends Religious  Services: Not on file  . Active Member of Clubs or Organizations: Not on file  . Attends Archivist Meetings: Not on file  . Marital Status: Not on file  Intimate Partner Violence:   . Fear of Current or Ex-Partner: Not on file  . Emotionally Abused: Not on file  . Physically Abused: Not on file  . Sexually Abused: Not on file    Outpatient Medications Prior to Visit  Medication Sig Dispense Refill  . cyclobenzaprine (FLEXERIL) 10 MG tablet Take 1 tablet (10 mg total) by mouth 3 (three) times daily as needed for muscle spasms. And headaches. 90 tablet 6  . ibuprofen (ADVIL,MOTRIN) 200 MG tablet Take 600-800 mg by mouth as needed (for cramps).    . Multiple Vitamin (MULTIVITAMIN) capsule Take by mouth.    . naproxen sodium (ALEVE) 220 MG  tablet Take 440 mg by mouth as needed.    . naratriptan (AMERGE) 2.5 MG tablet TAKE 1 TABLET BY MOUTH AT ONSET OF HEADACHE; MAY REPEAT ONCE IN 2 HOURS. AS NEEDED FOR MIGRAINE. 9 tablet 11  . propranolol ER (INDERAL LA) 160 MG SR capsule Take 1 capsule by mouth once daily 30 capsule 3  . triamcinolone cream (KENALOG) 0.5 % APPLY CREAM TOPICALLY TO AFFECTED AREA 4 TIMES DAILY    . Vitamin D, Cholecalciferol, 25 MCG (1000 UT) TABS Take by mouth. 2,000 units daily     No facility-administered medications prior to visit.    No Known Allergies  ROS Review of Systems  All other systems reviewed and are negative.     Objective:    Physical Exam Vitals reviewed.  Constitutional:      Appearance: She is obese.  HENT:     Head: Normocephalic and atraumatic.     Right Ear: Tympanic membrane normal.     Nose: Nose normal.     Mouth/Throat:     Mouth: Mucous membranes are moist.     Pharynx: Oropharynx is clear.  Eyes:     Comments: Glasses   Cardiovascular:     Rate and Rhythm: Normal rate and regular rhythm.     Pulses: Normal pulses.     Heart sounds: Normal heart sounds.  Pulmonary:     Effort: Pulmonary effort is normal.     Breath sounds: Normal breath sounds.  Abdominal:     General: Bowel sounds are normal. There is no distension.     Palpations: Abdomen is soft. There is no mass.     Tenderness: There is abdominal tenderness. There is no right CVA tenderness, left CVA tenderness or rebound.     Hernia: No hernia is present.  Musculoskeletal:        General: Normal range of motion.     Cervical back: Normal range of motion.  Skin:    General: Skin is warm and dry.     Capillary Refill: Capillary refill takes less than 2 seconds.  Neurological:     General: No focal deficit present.     Mental Status: She is alert and oriented to person, place, and time.  Psychiatric:        Mood and Affect: Mood normal.        Behavior: Behavior normal.        Thought Content:  Thought content normal.        Judgment: Judgment normal.     BP 132/84   Pulse 70   Temp 97.7 F (36.5 C) (Temporal)   Ht 5\' 4"  (  1.626 m)   Wt 210 lb (95.3 kg)   SpO2 100%   BMI 36.05 kg/m  Wt Readings from Last 3 Encounters:  01/13/20 210 lb (95.3 kg)  01/01/20 210 lb 2 oz (95.3 kg)  11/12/19 212 lb (96.2 kg)     Health Maintenance Due  Topic Date Due  . PAP SMEAR-Modifier  03/24/2018  . INFLUENZA VACCINE  12/27/2019    There are no preventive care reminders to display for this patient.  Lab Results  Component Value Date   TSH 4.270 11/12/2019   Lab Results  Component Value Date   WBC 6.3 09/10/2019   HGB 12.7 09/10/2019   HCT 37.7 09/10/2019   MCV 88 09/10/2019   PLT 290 09/10/2019   Lab Results  Component Value Date   NA 137 01/13/2020   K 4.3 01/13/2020   CO2 24 02/25/2017   GLUCOSE 99 01/13/2020   BUN 10 01/13/2020   CREATININE 1.13 (H) 01/13/2020   BILITOT 0.5 01/13/2020   ALKPHOS 53 01/13/2020   AST 17 01/13/2020   ALT 17 02/25/2017   PROT 7.1 01/13/2020   ALBUMIN 4.3 01/13/2020   CALCIUM 9.3 01/13/2020   ANIONGAP 6 02/25/2017   Lab Results  Component Value Date   CHOL 132 09/10/2019   Lab Results  Component Value Date   HDL 51 09/10/2019   Lab Results  Component Value Date   LDLCALC 70 09/10/2019   Lab Results  Component Value Date   TRIG 47 09/10/2019   Lab Results  Component Value Date   CHOLHDL 2.6 09/10/2019   Lab Results  Component Value Date   HGBA1C 5.4 09/10/2019      Assessment & Plan:   Problem List Items Addressed This Visit      Cardiovascular and Mediastinum   Benign essential HTN - Primary Encouraged on going compliance with current medication regimen Encouraged home monitoring and recording BP <130/80 Eating a heart-healthy diet with less salt Encouraged regular physical activity  Recommend Weight loss    Relevant Orders   Comp. Metabolic Panel (12) (Completed)     Endocrine    Hyperthyroidism Will continue to monitor     Other Visit Diagnoses    Encounter for hepatitis C screening test for low risk patient       Relevant Orders   Hepatitis C antibody (Completed)   Encounter for screening for HIV       Relevant Orders   HIV Antibody (routine testing w rflx) (Completed)   Abdominal pain, chronic, right lower quadrant     Encourage follow-up with gastroenterology for final CT scan results.  Patient expresses that she would like to have the cyst removed.      No orders of the defined types were placed in this encounter.   Follow-up: Return in about 3 months (around 04/14/2020).    Vevelyn Francois, NP

## 2020-01-13 NOTE — Patient Instructions (Signed)
Appendicitis, Adult  The appendix is a tube in the body that is shaped like a finger. It is attached to the large intestine. Appendicitis means that this tube is swollen (inflamed). If this is not treated, the tube can tear (rupture). This can lead to a life-threatening infection. This condition can also cause pus to build up in the appendix (abscess). What are the causes? This condition may be caused by something that blocks the appendix. These include:  A ball of poop (stool).  Lymph glands that are bigger than normal. Sometimes the cause is not known. What increases the risk? You are more likely to develop this condition if you are between 10 and 30 years of age. What are the signs or symptoms? Symptoms of this condition include:  Pain around the belly button (navel). ? The pain moves toward the lower right belly (abdomen). ? The pain can get worse with time. ? The pain can get worse if you cough. ? The pain can get worse if you move suddenly.  Tenderness in the lower right belly.  Feeling sick to your stomach (nauseous).  Throwing up (vomiting).  Not feeling hungry (loss of appetite).  A fever.  Having trouble pooping (constipation).  Watery poop (diarrhea).  Not feeling well. How is this treated? Most often, this condition is treated by taking out the appendix (appendectomy). There are two ways to do this:  Open surgery. For this method, the appendix is taken out through a large cut (incision). The cut is made in the lower right belly. This surgery may be used if: ? You have scars from another surgery. ? You have a bleeding condition. ? You are pregnant and will be having your baby soon. ? You have a condition that makes it hard to do the other type of surgery.  Laparoscopic surgery. For this method, the appendix is taken out through small cuts. Often, this surgery: ? Causes less pain. ? Causes fewer problems. ? Is easier to heal from. If your appendix tears and  pus forms:  A drain may be put into the sore. The drain will be used to get rid of the pus.  You may get an antibiotic medicine through an IV line.  Your appendix may or may not need to be taken out. Follow these instructions at home: If you had surgery, follow instructions from your doctor on how to care for yourself at home and how to take care of your cut from surgery. Medicines  Take over-the-counter and prescription medicines only as told by your doctor.  If you were prescribed an antibiotic medicine, take it as told by your doctor. Do not stop taking the antibiotic even if you start to feel better. Eating and drinking Follow instructions from your doctor about what you cannot eat or drink. You may go back to your diet slowly if:  You no longer feel sick to your stomach.  You have stopped throwing up. General instructions  Do not use any products that contain nicotine or tobacco, such as cigarettes, e-cigarettes, and chewing tobacco. If you need help quitting, ask your doctor.  Do not drive or use heavy machinery while taking prescription pain medicine.  Ask your doctor if the medicine you are taking can cause trouble pooping. You may need to take steps to prevent or treat trouble pooping: ? Drink enough fluid to keep your pee (urine) pale yellow. ? Take over-the-counter or prescription medicines. ? Eat foods that are high in fiber. These include beans,   whole grains, and fresh fruits and vegetables. ? Limit foods that are high in fat and sugar. These include fried or sweet foods.  Keep all follow-up visits as told by your doctor. This is important. Contact a doctor if:  There is pus, blood, or a lot of fluid coming from your cut or cuts from surgery.  You are sick to your stomach or you throw up. Get help right away if:  You have pain in your belly, and the pain is getting worse.  You have a fever.  You have chills.  You are very tired.  You have muscle  pain.  You are short of breath. Summary  Appendicitis is swelling of the appendix. The appendix is a tube that is shaped like a finger. It is joined to the large intestine.  This condition may be caused by something that blocks the appendix. This can lead to an infection.  This condition is usually treated by taking out the appendix. This information is not intended to replace advice given to you by your health care provider. Make sure you discuss any questions you have with your health care provider. Document Revised: 10/30/2017 Document Reviewed: 10/30/2017 Elsevier Patient Education  2020 Elsevier Inc[employer]  

## 2020-01-14 ENCOUNTER — Encounter: Payer: Self-pay | Admitting: Nurse Practitioner

## 2020-01-14 LAB — COMP. METABOLIC PANEL (12)
AST: 17 IU/L (ref 0–40)
Albumin/Globulin Ratio: 1.5 (ref 1.2–2.2)
Albumin: 4.3 g/dL (ref 3.8–4.8)
Alkaline Phosphatase: 53 IU/L (ref 48–121)
BUN/Creatinine Ratio: 9 (ref 9–23)
BUN: 10 mg/dL (ref 6–20)
Bilirubin Total: 0.5 mg/dL (ref 0.0–1.2)
Calcium: 9.3 mg/dL (ref 8.7–10.2)
Chloride: 103 mmol/L (ref 96–106)
Creatinine, Ser: 1.13 mg/dL — ABNORMAL HIGH (ref 0.57–1.00)
GFR calc Af Amer: 73 mL/min/{1.73_m2} (ref >59)
GFR calc non Af Amer: 63 mL/min/{1.73_m2} (ref >59)
Globulin, Total: 2.8 g/dL (ref 1.5–4.5)
Glucose: 99 mg/dL (ref 65–99)
Potassium: 4.3 mmol/L (ref 3.5–5.2)
Sodium: 137 mmol/L (ref 134–144)
Total Protein: 7.1 g/dL (ref 6.0–8.5)

## 2020-01-14 LAB — HEPATITIS C ANTIBODY: Hep C Virus Ab: 0.1 s/co ratio (ref 0.0–0.9)

## 2020-01-14 LAB — HIV ANTIBODY (ROUTINE TESTING W REFLEX): HIV Screen 4th Generation wRfx: NONREACTIVE

## 2020-03-10 ENCOUNTER — Other Ambulatory Visit: Payer: Self-pay

## 2020-03-10 MED ORDER — NARATRIPTAN HCL 2.5 MG PO TABS: ORAL_TABLET | ORAL | 0 refills | Status: DC

## 2020-03-14 ENCOUNTER — Other Ambulatory Visit: Payer: Self-pay | Admitting: *Deleted

## 2020-03-15 MED ORDER — NARATRIPTAN HCL 2.5 MG PO TABS: ORAL_TABLET | ORAL | 0 refills | Status: DC

## 2020-03-15 NOTE — Addendum Note (Signed)
Addended by: Gildardo Griffes on: 03/15/2020 02:12 PM   Modules accepted: Orders

## 2020-03-16 MED ORDER — NARATRIPTAN HCL 2.5 MG PO TABS: ORAL_TABLET | ORAL | 0 refills | Status: DC

## 2020-03-16 NOTE — Addendum Note (Signed)
Addended by: Gildardo Griffes on: 03/16/2020 11:29 AM   Modules accepted: Orders

## 2020-04-04 ENCOUNTER — Other Ambulatory Visit: Payer: Self-pay | Admitting: Nurse Practitioner

## 2020-04-05 NOTE — Telephone Encounter (Signed)
Please see request

## 2020-04-14 ENCOUNTER — Encounter: Payer: Self-pay | Admitting: Nurse Practitioner

## 2020-04-14 ENCOUNTER — Ambulatory Visit (INDEPENDENT_AMBULATORY_CARE_PROVIDER_SITE_OTHER): Payer: No Typology Code available for payment source | Admitting: Nurse Practitioner

## 2020-04-14 ENCOUNTER — Other Ambulatory Visit: Payer: Self-pay

## 2020-04-14 VITALS — BP 136/92 | HR 70 | Temp 97.5°F | Resp 18 | Ht 64.0 in | Wt 213.0 lb

## 2020-04-14 DIAGNOSIS — E669 Obesity, unspecified: Secondary | ICD-10-CM | POA: Diagnosis not present

## 2020-04-14 DIAGNOSIS — R5383 Other fatigue: Secondary | ICD-10-CM | POA: Diagnosis not present

## 2020-04-14 DIAGNOSIS — I10 Essential (primary) hypertension: Secondary | ICD-10-CM | POA: Diagnosis not present

## 2020-04-14 DIAGNOSIS — Z1389 Encounter for screening for other disorder: Secondary | ICD-10-CM

## 2020-04-14 LAB — POCT URINALYSIS DIP (CLINITEK)
Bilirubin, UA: NEGATIVE
Blood, UA: NEGATIVE
Glucose, UA: NEGATIVE mg/dL
Ketones, POC UA: NEGATIVE mg/dL
Leukocytes, UA: NEGATIVE
Nitrite, UA: NEGATIVE
POC PROTEIN,UA: NEGATIVE
Spec Grav, UA: >=1.03 — AB (ref 1.010–1.025)
Urobilinogen, UA: 0.2 E.U./dL
pH, UA: 5.5 (ref 5.0–8.0)

## 2020-04-14 NOTE — Progress Notes (Signed)
[address] [place], [place]  [address] Phone:  [phone]   Fax:  [phone] .   Established Patient Office Visit  Subjective:  Patient ID: Donna Bradshaw, female    DOB: 1986-02-09  Age: 34 y.o. MRN: [id number]  CC:  Chief Complaint  Patient presents with  . Follow-up    HPI Donna Bradshaw presents for follow up.  She  has a past medical history of Hyperthyroidism, Migraine, and Uterine fibroid.   Hypertension Patient is here for follow-up of elevated blood pressure. She is not exercising and is not adherent to a low-salt diet. Blood pressure is not monitored at home. Cardiac symptoms: fatigue. Patient denies chest pain, chest pressure/discomfort, claudication, dyspnea, exertional chest pressure/discomfort, irregular heart beat, near-syncope, orthopnea, palpitations, paroxysmal nocturnal dyspnea, syncope and tachypnea. Cardiovascular risk factors: hypertension, obesity (BMI >= 30 kg/m2) and sedentary lifestyle. Use of agents associated with hypertension: none. History of target organ damage: none.   Past Medical History:  Diagnosis Date  . Hyperthyroidism    no longer an issue  . Migraine   . Uterine fibroid     Past Surgical History:  Procedure Laterality Date  . MYOMECTOMY N/A 03/05/2017   Procedure: MYOMECTOMY, ABDOMINAL;  Surgeon: Linda Hedges, DO;  Location: [facility];  Service: Gynecology;  Laterality: N/A;  . NO PAST SURGERIES    . WISDOM TOOTH EXTRACTION      Family History  Problem Relation Age of Onset  . Diabetes Maternal Uncle   . Diabetes Maternal Grandmother   . Cancer Other   . Seizures Other   . Breast cancer Paternal Grandmother   . Migraines Neg Hx   . Colon cancer Neg Hx   . Stomach cancer Neg Hx   . Pancreatic cancer Neg Hx   . Esophageal cancer Neg Hx     Social History   Socioeconomic History  . Marital status: Single    Spouse name: Not on file  . Number of children: 0  . Years of education: 12+    . Highest education level: Not on file  Occupational History  . Occupation: Occupational psychologist  Tobacco Use  . Smoking status: Never Smoker  . Smokeless tobacco: Never Used  Vaping Use  . Vaping Use: Never used  Substance and Sexual Activity  . Alcohol use: Yes    Alcohol/week: 0.0 standard drinks    Comment: occ  . Drug use: No  . Sexual activity: Yes    Birth control/protection: None  Other Topics Concern  . Not on file  Social History Narrative   Lives w/ sister.   Caffeine use: 1-2 times per week   Right handed   Social Determinants of Health   Financial Resource Strain:   . Difficulty of Paying Living Expenses: Not on file  Food Insecurity:   . Worried About Charity fundraiser in the Last Year: Not on file  . Ran Out of Food in the Last Year: Not on file  Transportation Needs:   . Lack of Transportation (Medical): Not on file  . Lack of Transportation (Non-Medical): Not on file  Physical Activity:   . Days of Exercise per Week: Not on file  . Minutes of Exercise per Session: Not on file  Stress:   . Feeling of Stress : Not on file  Social Connections:   . Frequency of Communication with Friends and Family: Not on file  . Frequency of Social Gatherings with Friends and Family: Not  on file  . Attends Religious Services: Not on file  . Active Member of Clubs or Organizations: Not on file  . Attends Archivist Meetings: Not on file  . Marital Status: Not on file  Intimate Partner Violence:   . Fear of Current or Ex-Partner: Not on file  . Emotionally Abused: Not on file  . Physically Abused: Not on file  . Sexually Abused: Not on file    Outpatient Medications Prior to Visit  Medication Sig Dispense Refill  . cyclobenzaprine (FLEXERIL) 10 MG tablet Take 1 tablet (10 mg total) by mouth 3 (three) times daily as needed for muscle spasms. And headaches. 90 tablet 6  . ibuprofen (ADVIL,MOTRIN) 200 MG tablet Take 600-800 mg by mouth as needed (for cramps).     . Multiple Vitamin (MULTIVITAMIN) capsule Take by mouth.    . naproxen sodium (ALEVE) 220 MG tablet Take 440 mg by mouth as needed.    . naratriptan (AMERGE) 2.5 MG tablet TAKE 1 TABLET BY MOUTH AT ONSET OF HEADACHE; MAY REPEAT ONCE IN 2 HOURS. AS NEEDED FOR MIGRAINE. Must been seen for further refills. 9 tablet 0  . propranolol ER (INDERAL LA) 160 MG SR capsule Take 1 capsule by mouth once daily 30 capsule 11  . triamcinolone cream (KENALOG) 0.5 % APPLY CREAM TOPICALLY TO AFFECTED AREA 4 TIMES DAILY    . Vitamin D, Cholecalciferol, 25 MCG (1000 UT) TABS Take by mouth. 2,000 units daily     No facility-administered medications prior to visit.    No Known Allergies  ROS Review of Systems  Constitutional: Positive for fatigue.  Respiratory: Negative for shortness of breath.   Cardiovascular: Negative for chest pain.  Gastrointestinal: Negative.   Neurological: Positive for weakness and headaches (improving 2 days related to cycle. She did have migraine ).  Psychiatric/Behavioral:       Depression occasionally       Objective:    Physical Exam Neurological:     Mental Status: She is alert.     BP (!) 136/92 (BP Location: Left Arm, Patient Position: Sitting, Cuff Size: Large)   Pulse 70   Temp (!) 97.5 F (36.4 C) (Temporal)   Resp 18   Ht 5\' 4"  (1.626 m)   Wt 213 lb (96.6 kg)   SpO2 100%   BMI 36.56 kg/m  Wt Readings from Last 3 Encounters:  04/14/20 213 lb (96.6 kg)  01/13/20 210 lb (95.3 kg)  01/01/20 210 lb 2 oz (95.3 kg)     Health Maintenance Due  Topic Date Due  . PAP SMEAR-Modifier  03/24/2018    There are no preventive care reminders to display for this patient.  Lab Results  Component Value Date   TSH 4.970 (H) 04/14/2020   Lab Results  Component Value Date   WBC 4.9 04/14/2020   HGB 11.3 04/14/2020   HCT 34.1 04/14/2020   MCV 83 04/14/2020   PLT 364 04/14/2020   Lab Results  Component Value Date   NA 138 04/14/2020   K 4.5 04/14/2020    CO2 24 02/25/2017   GLUCOSE 104 (H) 04/14/2020   BUN 11 04/14/2020   CREATININE 1.12 (H) 04/14/2020   BILITOT 0.5 04/14/2020   ALKPHOS 49 04/14/2020   AST 17 04/14/2020   ALT 17 02/25/2017   PROT 6.7 04/14/2020   ALBUMIN 4.3 04/14/2020   CALCIUM 9.3 04/14/2020   ANIONGAP 6 02/25/2017   Lab Results  Component Value Date   CHOL 132  09/10/2019   Lab Results  Component Value Date   HDL 51 09/10/2019   Lab Results  Component Value Date   LDLCALC 70 09/10/2019   Lab Results  Component Value Date   TRIG 47 09/10/2019   Lab Results  Component Value Date   CHOLHDL 2.6 09/10/2019   Lab Results  Component Value Date   HGBA1C 5.4 09/10/2019      Assessment & Plan:   Problem List Items Addressed This Visit      Cardiovascular and Mediastinum   Benign essential HTN - Primary Encouraged on going compliance with current medication regimen Encouraged home monitoring and recording BP <130/80 Eating a heart-healthy diet with less salt Encouraged regular physical activity  Recommend Weight loss   Relevant Orders   POCT URINALYSIS DIP (CLINITEK) (Completed)   CBC with Differential/Platelet (Completed)   Comp. Metabolic Panel (12) (Completed)    Other Visit Diagnoses    Fatigue, unspecified type       Relevant Orders   TSH (Completed)   Obesity, Class II, BMI 35-39.9     Obesity with BMI and comorbidities as noted above.  Discussed with patient short-term and long-term goals.  Encourage patient to add water to her diet.  We talked about how important hydration with water can be.  Discussed trying to get at least 64 to 90 ounces of water per day discussed portion control along with healthy habits. Discussed proper diet (low fat, low sodium, high fiber) with patient.   Discussed need for regular exercise (3 times per week, 20 minutes per session) with patient. Patient is able to make some lifestyle modifications and show promise of weight loss at least 3 to 5 pounds in the  next 3 months weight loss supplement possible phentermine to be considered discussed with patient about lifestyle modifications and not using a quick fix because studies have shown the weight will return without modifications and lifestyle.  Patient verbalized understanding   Relevant Orders   TSH (Completed)      No orders of the defined types were placed in this encounter.   Follow-up: Return in about 3 months (around 07/15/2020).    Vevelyn Francois, NP

## 2020-04-14 NOTE — Progress Notes (Signed)
Wants to discuss anxiety/irritibility, believes possibly r/t thyroid function

## 2020-04-14 NOTE — Patient Instructions (Signed)
Healthy Eating Following a healthy eating pattern may help you to achieve and maintain a healthy body weight, reduce the risk of chronic disease, and live a long and productive life. It is important to follow a healthy eating pattern at an appropriate calorie level for your body. Your nutritional needs should be met primarily through food by choosing a variety of nutrient-rich foods. What are tips for following this plan? Reading food labels  Read labels and choose the following: ? Reduced or low sodium. ? Juices with 100% fruit juice. ? Foods with low saturated fats and high polyunsaturated and monounsaturated fats. ? Foods with whole grains, such as whole wheat, cracked wheat, brown rice, and wild rice. ? Whole grains that are fortified with folic acid. This is recommended for women who are pregnant or who want to become pregnant.  Read labels and avoid the following: ? Foods with a lot of added sugars. These include foods that contain brown sugar, corn sweetener, corn syrup, dextrose, fructose, glucose, high-fructose corn syrup, honey, invert sugar, lactose, malt syrup, maltose, molasses, raw sugar, sucrose, trehalose, or turbinado sugar.  Do not eat more than the following amounts of added sugar per day:  6 teaspoons (25 g) for women.  9 teaspoons (38 g) for men. ? Foods that contain processed or refined starches and grains. ? Refined grain products, such as white flour, degermed cornmeal, white bread, and white rice. Shopping  Choose nutrient-rich snacks, such as vegetables, whole fruits, and nuts. Avoid high-calorie and high-sugar snacks, such as potato chips, fruit snacks, and candy.  Use oil-based dressings and spreads on foods instead of solid fats such as butter, stick margarine, or cream cheese.  Limit pre-made sauces, mixes, and "instant" products such as flavored rice, instant noodles, and ready-made pasta.  Try more plant-protein sources, such as tofu, tempeh, black beans,  edamame, lentils, nuts, and seeds.  Explore eating plans such as the Mediterranean diet or vegetarian diet. Cooking  Use oil to saut or stir-fry foods instead of solid fats such as butter, stick margarine, or lard.  Try baking, boiling, grilling, or broiling instead of frying.  Remove the fatty part of meats before cooking.  Steam vegetables in water or broth. Meal planning   At meals, imagine dividing your plate into fourths: ? One-half of your plate is fruits and vegetables. ? One-fourth of your plate is whole grains. ? One-fourth of your plate is protein, especially lean meats, poultry, eggs, tofu, beans, or nuts.  Include low-fat dairy as part of your daily diet. Lifestyle  Choose healthy options in all settings, including home, work, school, restaurants, or stores.  Prepare your food safely: ? Wash your hands after handling raw meats. ? Keep food preparation surfaces clean by regularly washing with hot, soapy water. ? Keep raw meats separate from ready-to-eat foods, such as fruits and vegetables. ? Cook seafood, meat, poultry, and eggs to the recommended internal temperature. ? Store foods at safe temperatures. In general:  Keep cold foods at 59F (4.4C) or below.  Keep hot foods at 159F (60C) or above.  Keep your freezer at [facility] (-17.8C) or below.  Foods are no longer safe to eat when they have been between the temperatures of 40-159F (4.4-60C) for more than 2 hours. What foods should I eat? Fruits Aim to eat 2 cup-equivalents of fresh, canned (in natural juice), or frozen fruits each day. Examples of 1 cup-equivalent of fruit include 1 small apple, 8 large strawberries, 1 cup canned fruit,  cup  dried fruit, or 1 cup 100% juice. Vegetables Aim to eat 2-3 cup-equivalents of fresh and frozen vegetables each day, including different varieties and colors. Examples of 1 cup-equivalent of vegetables include 2 medium carrots, 2 cups raw, leafy greens, 1 cup chopped  vegetable (raw or cooked), or 1 medium baked potato. Grains Aim to eat 6 ounce-equivalents of whole grains each day. Examples of 1 ounce-equivalent of grains include 1 slice of bread, 1 cup ready-to-eat cereal, 3 cups popcorn, or  cup cooked rice, pasta, or cereal. Meats and other proteins Aim to eat 5-6 ounce-equivalents of protein each day. Examples of 1 ounce-equivalent of protein include 1 egg, 1/2 cup nuts or seeds, or 1 tablespoon (16 g) peanut butter. A cut of meat or fish that is the size of a deck of cards is about 3-4 ounce-equivalents.  Of the protein you eat each week, try to have at least 8 ounces come from seafood. This includes salmon, trout, herring, and anchovies. Dairy Aim to eat 3 cup-equivalents of fat-free or low-fat dairy each day. Examples of 1 cup-equivalent of dairy include 1 cup (240 mL) milk, 8 ounces (250 g) yogurt, 1 ounces (44 g) natural cheese, or 1 cup (240 mL) fortified soy milk. Fats and oils  Aim for about 5 teaspoons (21 g) per day. Choose monounsaturated fats, such as canola and olive oils, avocados, peanut butter, and most nuts, or polyunsaturated fats, such as sunflower, corn, and soybean oils, walnuts, pine nuts, sesame seeds, sunflower seeds, and flaxseed. Beverages  Aim for six 8-oz glasses of water per day. Limit coffee to three to five 8-oz cups per day.  Limit caffeinated beverages that have added calories, such as soda and energy drinks.  Limit alcohol intake to no more than 1 drink a day for nonpregnant women and 2 drinks a day for men. One drink equals 12 oz of beer (355 mL), 5 oz of wine (148 mL), or 1 oz of hard liquor (44 mL). Seasoning and other foods  Avoid adding excess amounts of salt to your foods. Try flavoring foods with herbs and spices instead of salt.  Avoid adding sugar to foods.  Try using oil-based dressings, sauces, and spreads instead of solid fats. This information is based on general [place] nutrition guidelines. For more  information, visit [employer]. Exact amounts may vary based on your nutrition needs. Summary  A healthy eating plan may help you to maintain a healthy weight, reduce the risk of chronic diseases, and stay active throughout your life.  Plan your meals. Make sure you eat the right portions of a variety of nutrient-rich foods.  Try baking, boiling, grilling, or broiling instead of frying.  Choose healthy options in all settings, including home, work, school, restaurants, or stores. This information is not intended to replace advice given to you by your health care provider. Make sure you discuss any questions you have with your health care provider. Document Revised: 08/26/2017 Document Reviewed: 08/26/2017 Elsevier Patient Education  [place].

## 2020-04-15 LAB — CBC WITH DIFFERENTIAL/PLATELET
Basophils Absolute: 0 10*3/uL (ref 0.0–0.2)
Basos: 0 %
EOS (ABSOLUTE): 0.2 10*3/uL (ref 0.0–0.4)
Eos: 3 %
Hematocrit: 34.1 % (ref 34.0–46.6)
Hemoglobin: 11.3 g/dL (ref 11.1–15.9)
Immature Grans (Abs): 0 10*3/uL (ref 0.0–0.1)
Immature Granulocytes: 0 %
Lymphocytes Absolute: 2.1 10*3/uL (ref 0.7–3.1)
Lymphs: 42 %
MCH: 27.4 pg (ref 26.6–33.0)
MCHC: 33.1 g/dL (ref 31.5–35.7)
MCV: 83 fL (ref 79–97)
Monocytes Absolute: 0.5 10*3/uL (ref 0.1–0.9)
Monocytes: 9 %
Neutrophils Absolute: 2.2 10*3/uL (ref 1.4–7.0)
Neutrophils: 46 %
Platelets: 364 10*3/uL (ref 150–450)
RBC: 4.12 x10E6/uL (ref 3.77–5.28)
RDW: 13.2 % (ref 11.7–15.4)
WBC: 4.9 10*3/uL (ref 3.4–10.8)

## 2020-04-15 LAB — COMP. METABOLIC PANEL (12)
AST: 17 IU/L (ref 0–40)
Albumin/Globulin Ratio: 1.8 (ref 1.2–2.2)
Albumin: 4.3 g/dL (ref 3.8–4.8)
Alkaline Phosphatase: 49 IU/L (ref 44–121)
BUN/Creatinine Ratio: 10 (ref 9–23)
BUN: 11 mg/dL (ref 6–20)
Bilirubin Total: 0.5 mg/dL (ref 0.0–1.2)
Calcium: 9.3 mg/dL (ref 8.7–10.2)
Chloride: 103 mmol/L (ref 96–106)
Creatinine, Ser: 1.12 mg/dL — ABNORMAL HIGH (ref 0.57–1.00)
GFR calc Af Amer: 73 mL/min/{1.73_m2} (ref >59)
GFR calc non Af Amer: 63 mL/min/{1.73_m2} (ref >59)
Globulin, Total: 2.4 g/dL (ref 1.5–4.5)
Glucose: 104 mg/dL — ABNORMAL HIGH (ref 65–99)
Potassium: 4.5 mmol/L (ref 3.5–5.2)
Sodium: 138 mmol/L (ref 134–144)
Total Protein: 6.7 g/dL (ref 6.0–8.5)

## 2020-04-15 LAB — TSH: TSH: 4.97 u[IU]/mL — ABNORMAL HIGH (ref 0.450–4.500)

## 2020-05-09 ENCOUNTER — Telehealth: Payer: Self-pay | Admitting: Family Medicine

## 2020-05-09 ENCOUNTER — Encounter: Payer: Self-pay | Admitting: *Deleted

## 2020-05-09 MED ORDER — NARATRIPTAN HCL 2.5 MG PO TABS: ORAL_TABLET | ORAL | 0 refills | Status: DC

## 2020-05-09 NOTE — Telephone Encounter (Signed)
Pt has scheduled her annual f/u and is now asking for a refill on her naratriptan (AMERGE) 2.5 MG tablet to [place] [id number] (pt was asked to contact pharmacy 1st for future refills, pt stated she was informed by pharmacy she needed to schedule a office visit 1st)

## 2020-05-09 NOTE — Addendum Note (Signed)
Addended by: Minna Antis on: 05/09/2020 01:32 PM   Modules accepted: Orders

## 2020-05-09 NOTE — Telephone Encounter (Signed)
Amerge refilled x 1 month. Sent her my chart to advise.

## 2020-06-09 ENCOUNTER — Other Ambulatory Visit: Payer: Self-pay | Admitting: Neurology

## 2020-07-14 ENCOUNTER — Other Ambulatory Visit: Payer: Self-pay

## 2020-07-14 ENCOUNTER — Ambulatory Visit (INDEPENDENT_AMBULATORY_CARE_PROVIDER_SITE_OTHER): Payer: BC Managed Care – PPO | Admitting: Family Medicine

## 2020-07-14 ENCOUNTER — Encounter: Payer: Self-pay | Admitting: Nurse Practitioner

## 2020-07-14 ENCOUNTER — Encounter: Payer: Self-pay | Admitting: Family Medicine

## 2020-07-14 ENCOUNTER — Ambulatory Visit (INDEPENDENT_AMBULATORY_CARE_PROVIDER_SITE_OTHER): Payer: No Typology Code available for payment source | Admitting: Nurse Practitioner

## 2020-07-14 VITALS — BP 124/84 | HR 75 | Temp 98.1°F | Ht 64.0 in | Wt 210.0 lb

## 2020-07-14 VITALS — BP 133/96 | HR 64 | Ht 64.0 in | Wt 211.0 lb

## 2020-07-14 DIAGNOSIS — I10 Essential (primary) hypertension: Secondary | ICD-10-CM

## 2020-07-14 DIAGNOSIS — G43009 Migraine without aura, not intractable, without status migrainosus: Secondary | ICD-10-CM

## 2020-07-14 MED ORDER — CYCLOBENZAPRINE HCL 10 MG PO TABS: 10.0000 mg | ORAL_TABLET | Freq: Every day | ORAL | 0 refills | Status: DC

## 2020-07-14 MED ORDER — NARATRIPTAN HCL 2.5 MG PO TABS: ORAL_TABLET | ORAL | 11 refills | Status: DC

## 2020-07-14 NOTE — Patient Instructions (Addendum)
Below is our plan:  We will continue propranolol ER 160mg  daily for prevention and naratriptan as needed for abortive therapy. May use cyclobenzaprine sparingly as needed for abortive therapy.   Please make sure you are staying well hydrated. I recommend 50-60 ounces daily. Well balanced diet and regular exercise encouraged. Consistent sleep schedule with 6-8 hours recommended.   Please continue follow up with care team as directed.   Follow up with PCP. May call us for worsening.   You may receive a survey regarding today's visit. I encourage you to leave honest feed back as I do use this information to improve patient care. Thank you for seeing me today!      Migraine Headache A migraine headache is a very strong throbbing pain on one side or both sides of your head. This type of headache can also cause other symptoms. It can last from 4 hours to 3 days. Talk with your doctor about what things may bring on (trigger) this condition. What are the causes? The exact cause of this condition is not known. This condition may be triggered or caused by:  Drinking alcohol.  Smoking.  Taking medicines, such as: ? Medicine used to treat chest pain (nitroglycerin). ? Birth control pills. ? Estrogen. ? Some blood pressure medicines.  Eating or drinking certain products.  Doing physical activity. Other things that may trigger a migraine headache include:  Having a menstrual period.  Pregnancy.  Hunger.  Stress.  Not getting enough sleep or getting too much sleep.  Weather changes.  Tiredness (fatigue). What increases the risk?  Being 61-45 years old.  Being female.  Having a family history of migraine headaches.  Being Caucasian.  Having depression or anxiety.  Being very overweight. What are the signs or symptoms?  A throbbing pain. This pain may: ? Happen in any area of the head, such as on one side or both sides. ? Make it hard to do daily activities. ? Get  worse with physical activity. ? Get worse around bright lights or loud noises.  Other symptoms may include: ? Feeling sick to your stomach (nauseous). ? Vomiting. ? Dizziness. ? Being sensitive to bright lights, loud noises, or smells.  Before you get a migraine headache, you may get warning signs (an aura). An aura may include: ? Seeing flashing lights or having blind spots. ? Seeing bright spots, halos, or zigzag lines. ? Having tunnel vision or blurred vision. ? Having numbness or a tingling feeling. ? Having trouble talking. ? Having weak muscles.  Some people have symptoms after a migraine headache (postdromal phase), such as: ? Tiredness. ? Trouble thinking (concentrating). How is this treated?  Taking medicines that: ? Relieve pain. ? Relieve the feeling of being sick to your stomach. ? Prevent migraine headaches.  Treatment may also include: ? Having acupuncture. ? Avoiding foods that bring on migraine headaches. ? Learning ways to control your body functions (biofeedback). ? Therapy to help you know and deal with negative thoughts (cognitive behavioral therapy). Follow these instructions at home: Medicines  Take over-the-counter and prescription medicines only as told by your doctor.  Ask your doctor if the medicine prescribed to you: ? Requires you to avoid driving or using heavy machinery. ? Can cause trouble pooping (constipation). You may need to take these steps to prevent or treat trouble pooping:  Drink enough fluid to keep your pee (urine) pale yellow.  Take over-the-counter or prescription medicines.  Eat foods that are high in fiber. These  include beans, whole grains, and fresh fruits and vegetables.  Limit foods that are high in fat and sugar. These include fried or sweet foods. Lifestyle  Do not drink alcohol.  Do not use any products that contain nicotine or tobacco, such as cigarettes, e-cigarettes, and chewing tobacco. If you need help  quitting, ask your doctor.  Get at least 8 hours of sleep every night.  Limit and deal with stress. General instructions  Keep a journal to find out what may bring on your migraine headaches. For example, write down: ? What you eat and drink. ? How much sleep you get. ? Any change in what you eat or drink. ? Any change in your medicines.  If you have a migraine headache: ? Avoid things that make your symptoms worse, such as bright lights. ? It may help to lie down in a dark, quiet room. ? Do not drive or use heavy machinery. ? Ask your doctor what activities are safe for you.  Keep all follow-up visits as told by your doctor. This is important.      Contact a doctor if:  You get a migraine headache that is different or worse than others you have had.  You have more than 15 headache days in one month. Get help right away if:  Your migraine headache gets very bad.  Your migraine headache lasts longer than 72 hours.  You have a fever.  You have a stiff neck.  You have trouble seeing.  Your muscles feel weak or like you cannot control them.  You start to lose your balance a lot.  You start to have trouble walking.  You pass out (faint).  You have a seizure. Summary  A migraine headache is a very strong throbbing pain on one side or both sides of your head. These headaches can also cause other symptoms.  This condition may be treated with medicines and changes to your lifestyle.  Keep a journal to find out what may bring on your migraine headaches.  Contact a doctor if you get a migraine headache that is different or worse than others you have had.  Contact your doctor if you have more than 15 headache days in a month. This information is not intended to replace advice given to you by your health care provider. Make sure you discuss any questions you have with your health care provider. Document Revised: 09/05/2018 Document Reviewed: 06/26/2018 Elsevier Patient  Education  [place].

## 2020-07-14 NOTE — Progress Notes (Addendum)
PATIENT: Donna Bradshaw DOB: 1986/02/20  REASON FOR VISIT: follow up HISTORY FROM: patient  Chief Complaint  Patient presents with  . Follow-up    Rm 1 alone  Pt is well, having maybe 3-4  migraines a month      HISTORY OF PRESENT ILLNESS: 07/14/20 ALL:  She returns for migraine follow up. PCP has continued refills for propranolol LA 160mg  daily with PCP. She continues naratriptan for abortive therapy. Occasionally she uses cyclobenzaprine. She feels that headaches are well managed. She has about 4-5 headache days a month with 2-3 migraines. Lack of sleep and menstrual cycle are most common triggers. She continues regular follow up with PCP. BP has been good.    02/17/2019 ALL:  Donna Bradshaw is a 35 y.o. female here today for follow up for migraines. She continues propranolol for prevention and Amerge for abortive therapy.  She reports that migraines are about the same. She has had an increase in frequency and intensity over the past two months. Baseline was 6 migraines per month, recently she has had 8-9 per month. She is going through a full rx of Amerge. She tries to avoid OTC analgesics but reports that there are some weeks where she has taken Aleve 3 times weekly. Usually Amerge, Aleve and Flexeril will abort headache. She has also noted increased BP. She has seen PCP but readings were normal at visit so she has continued to monitor. She is also under more stress at work.   HISTORY: (copied from Dr Cathren Laine note on 11/18/2017)  Interval history 11/18/2017:  Migraines have been ok, she has not been checking her blood pressure. It has been elevated. When she does get the headaches her amerge works. Contacts may have something to do with migraines. She has neck pain. Discussed taking Amerge right away, she doesn't take it right away. She 5 migraine days a month. Starts as a headache. Caffeine helps.   Interval history 05/08/2017: Here for follow up. 2 moderate migraines a month last  2-3 days so 6 migraine days a month. At this time using imitrex pill and works but comes back. She is taking 9   Medications tried: Onzetra, imitrex oral and nasal spray and powder, Cambia, Zomig, Maxalt none of these worked at [employer] migraine.   OXB:Donna Bradshaw Holdenis a 35 y.o.femalehere as a referral from Dr. Pat Kocher migraines. Migraines since her teenage years. She has throbbing behind the right eyeball and unilateral on the right extending to the neck. He has 5 days of migraines a month. She has nausea, she has vomited in the past. She has to go into a dark room which helps, she is sound sensitive and needs silence to sit still. 7/10 in pain. Unknown triggers except maybe around her period. 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 She takes it then by the end of the day the headache is back. She hates the way it makes her feel, like she is heavy and her chest is heavy. Rizatriptan did not help. No aura. She is having vision changes with the headaches.   Reviewed notes, labs and imaging from outside physicians, which showed: reviewed notes from [facility] physicians. Patient has migraines, h/o hyperthyroidism, menstrual irregularity. Was referred for migraine management. She was evaluated in 06/2014 for thyrotoxicosis, She had a neck pressure sensation x 2 weeks, sh ehad a low TSH and high fT4 with few thyrotoxic sxs: heat intolerance, insomnia, fatigue,which resolved suspected thyroiditis.  REVIEW OF SYSTEMS: Out of a complete 14 system review of symptoms, the patient complains only of the following symptoms, headaches and all other reviewed systems are negative.   ALLERGIES: No Known Allergies  HOME MEDICATIONS: Outpatient Medications Prior to Visit  Medication Sig Dispense Refill  . ibuprofen (ADVIL,MOTRIN) 200 MG tablet Take 600-800 mg by mouth as needed (for cramps).    . Multiple Vitamin (MULTIVITAMIN) capsule Take by mouth.    . naproxen sodium (ALEVE)  220 MG tablet Take 440 mg by mouth as needed.    . propranolol ER (INDERAL LA) 160 MG SR capsule Take 1 capsule by mouth once daily 30 capsule 11  . triamcinolone cream (KENALOG) 0.5 % APPLY CREAM TOPICALLY TO AFFECTED AREA 4 TIMES DAILY    . Vitamin D, Cholecalciferol, 25 MCG (1000 UT) TABS Take by mouth. 2,000 units daily    . cyclobenzaprine (FLEXERIL) 10 MG tablet Take 1 tablet (10 mg total) by mouth 3 (three) times daily as needed for muscle spasms. And headaches. 90 tablet 6  . naratriptan (AMERGE) 2.5 MG tablet TAKE 1 TABLET AT ONSET OF HEADACHE. MAY REPEAT ONCE IN 2 HOURS AS NEEDED FOR MIGRAINE 9 tablet 0   No facility-administered medications prior to visit.    PAST MEDICAL HISTORY: Past Medical History:  Diagnosis Date  . Hyperthyroidism    no longer an issue  . Migraine   . Uterine fibroid     PAST SURGICAL HISTORY: Past Surgical History:  Procedure Laterality Date  . MYOMECTOMY N/A 03/05/2017   Procedure: MYOMECTOMY, ABDOMINAL;  Surgeon: Linda Hedges, DO;  Location: [facility];  Service: Gynecology;  Laterality: N/A;  . NO PAST SURGERIES    . WISDOM TOOTH EXTRACTION      FAMILY HISTORY: Family History  Problem Relation Age of Onset  . Diabetes Maternal Uncle   . Diabetes Maternal Grandmother   . Cancer Other   . Seizures Other   . Breast cancer Paternal Grandmother   . Migraines Neg Hx   . Colon cancer Neg Hx   . Stomach cancer Neg Hx   . Pancreatic cancer Neg Hx   . Esophageal cancer Neg Hx     SOCIAL HISTORY: Social History   Socioeconomic History  . Marital status: Single    Spouse name: Not on file  . Number of children: 0  . Years of education: 12+  . Highest education level: Not on file  Occupational History  . Occupation: Occupational psychologist  Tobacco Use  . Smoking status: Never Smoker  . Smokeless tobacco: Never Used  Vaping Use  . Vaping Use: Never used  Substance and Sexual Activity  . Alcohol use: Yes    Alcohol/week: 0.0 standard drinks     Comment: occ  . Drug use: No  . Sexual activity: Yes    Birth control/protection: None  Other Topics Concern  . Not on file  Social History Narrative   Lives w/ sister.   Caffeine use: 1-2 times per week   Right handed   Social Determinants of Health   Financial Resource Strain: Not on file  Food Insecurity: Not on file  Transportation Needs: Not on file  Physical Activity: Not on file  Stress: Not on file  Social Connections: Not on file  Intimate Partner Violence: Not on file      PHYSICAL EXAM  Vitals:   07/14/20 1251  BP: (!) 133/96  Pulse: 64  Weight: 211 lb (95.7 kg)  Height: 5\' 4"  (1.626 m)  Body mass index is 36.22 kg/m.  Generalized: Well developed, in no acute distress  Cardiology: normal rate and rhythm, no murmur noted Neurological examination  Mentation: Alert oriented to time, place, history taking. Follows all commands speech and language fluent Cranial nerve II-XII: Pupils were equal round reactive to light. Extraocular movements were full, visual field were full on confrontational test. Facial sensation and strength were normal. Head turning and shoulder shrug  were normal and symmetric. Motor: The motor testing reveals 5 over 5 strength of all 4 extremities. Good symmetric motor tone is noted throughout.  Gait and station: Gait is normal.   DIAGNOSTIC DATA (LABS, IMAGING, TESTING) - I reviewed patient records, labs, notes, testing and imaging myself where available.  No flowsheet data found.   Lab Results  Component Value Date   WBC 4.9 04/14/2020   HGB 11.3 04/14/2020   HCT 34.1 04/14/2020   MCV 83 04/14/2020   PLT 364 04/14/2020      Component Value Date/Time   NA 138 04/14/2020 1010   K 4.5 04/14/2020 1010   CL 103 04/14/2020 1010   CO2 24 02/25/2017 1025   GLUCOSE 104 (H) 04/14/2020 1010   GLUCOSE 96 02/25/2017 1025   BUN 11 04/14/2020 1010   CREATININE 1.12 (H) 04/14/2020 1010   CALCIUM 9.3 04/14/2020 1010   PROT 6.7  04/14/2020 1010   ALBUMIN 4.3 04/14/2020 1010   AST 17 04/14/2020 1010   ALT 17 02/25/2017 1025   ALKPHOS 49 04/14/2020 1010   BILITOT 0.5 04/14/2020 1010   GFRNONAA 63 04/14/2020 1010   GFRAA 73 04/14/2020 1010   Lab Results  Component Value Date   CHOL 132 09/10/2019   HDL 51 09/10/2019   LDLCALC 70 09/10/2019   TRIG 47 09/10/2019   CHOLHDL 2.6 09/10/2019   Lab Results  Component Value Date   HGBA1C 5.4 09/10/2019   Lab Results  Component Value Date   EGBTDVVO16 073 09/10/2019   Lab Results  Component Value Date   TSH 4.970 (H) 04/14/2020       ASSESSMENT AND PLAN 35 y.o. year old female  has a past medical history of Hyperthyroidism, Migraine, and Uterine fibroid. here with     ICD-10-CM   1. Migraine without aura and without status migrainosus, not intractable  G43.009      Levi is tolerating propranolol ER 160 mg daily for migraine prevention as well as Amerge 2.5 mg as needed for abortive therapy. She will occasionally use cyclobenzaprine as well for abortive therapy.  She may use Aleve twice daily 5 days prior to onset of menstrual headaches. Advised against use of abortive medications more than 9-10 times per month. She was encouraged to stay well-hydrated and consider exercise for stress management.  She will follow-up with PCP for refills. She may call for follow up with neurology for any worsening. She verbalizes understanding and agreement with this plan.   No orders of the defined types were placed in this encounter.    Meds ordered this encounter  Medications  . naratriptan (AMERGE) 2.5 MG tablet    Sig: TAKE 1 TABLET AT ONSET OF HEADACHE. MAY REPEAT ONCE IN 2 HOURS AS NEEDED FOR MIGRAINE    Dispense:  9 tablet    Refill:  11    Order Specific Question:   Supervising Provider    Answer:   Melvenia Beam [id number]  . cyclobenzaprine (FLEXERIL) 10 MG tablet    Sig: Take 1 tablet (10 mg total) by  mouth at bedtime. And headaches.    Dispense:  90  tablet    Refill:  0    Order Specific Question:   Supervising Provider    Answer:   Melvenia Beam [id number]      I spent 15 minutes with the patient. 50% of this time was spent counseling and educating patient on plan of care and medications.    Debbora Presto, FNP-C 07/14/2020, 1:23 PM [facility] [address], [place], [place] [address] [phone]  Made any corrections needed, and agree with history, physical, neuro exam,assessment and plan as stated.     Sarina Ill, MD [facility]

## 2020-10-07 IMAGING — CT CT ABD-PELV W/ CM
2 of 4 series · 15 of 46 positions shown, 17 images · IV contrast (OMNIPAQUE 300)
Comparison: Abdominopelvic CT 01/16/2018.

CLINICAL DATA: Right upper quadrant abdominal pain with bilateral
flank pain intermittently for several years. Kidney stone suspected.
History of uterine myomectomy in 7993.

EXAM:
CT ABDOMEN AND PELVIS WITH CONTRAST
TECHNIQUE: Multidetector CT imaging of the abdomen and pelvis was performed
using the standard protocol following bolus administration of
intravenous contrast.
CONTRAST:  100mL OMNIPAQUE IOHEXOL 300 MG/ML  SOLN

[Series 2: abd/pel w · axial · 0.71mm/px · z∈[-464,-74]mm · 12 of 86 slices shown, 14 images]
[im 4/86  soft-tissue]
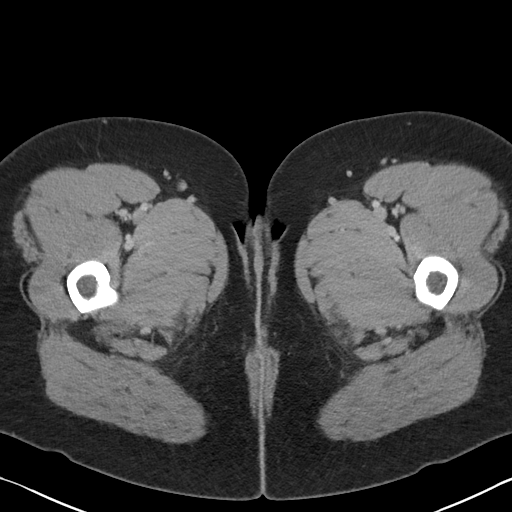
[im 4/86  bone]
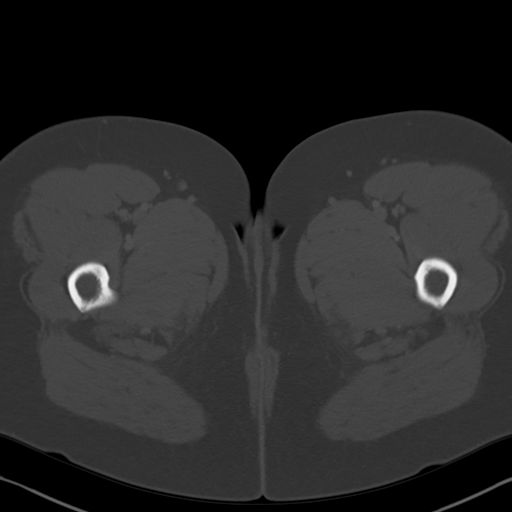
[im 11/86  soft-tissue]
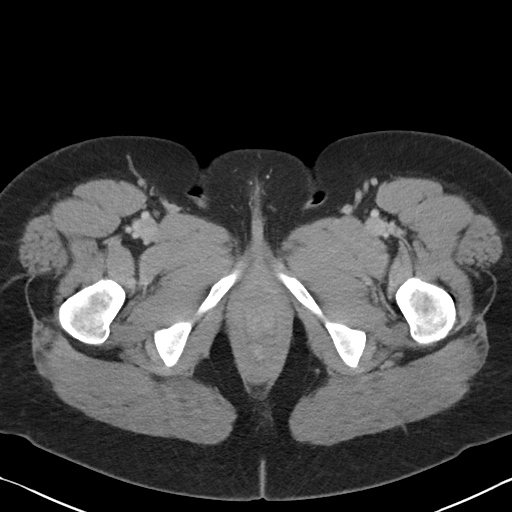
[im 18/86  soft-tissue]
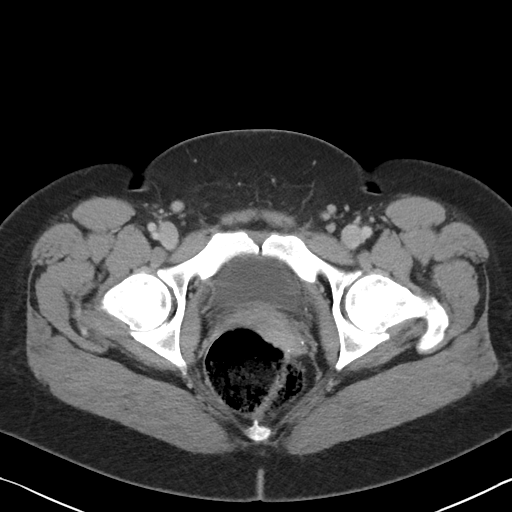
[im 25/86  soft-tissue]
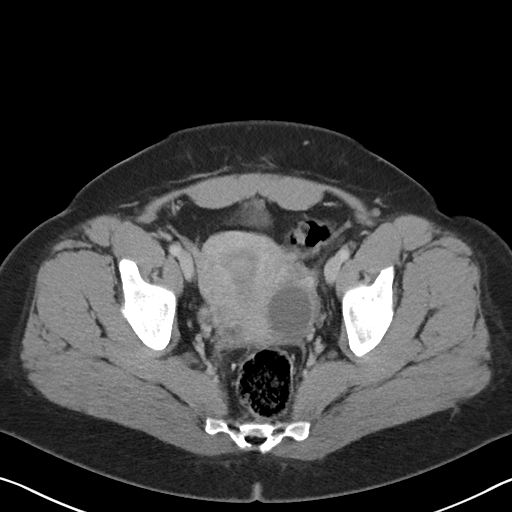
[im 32/86  soft-tissue]
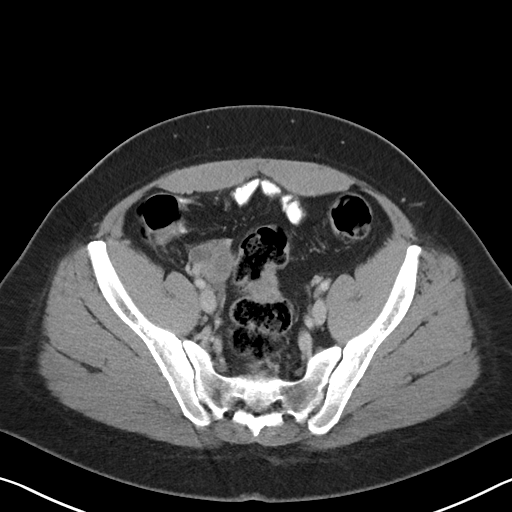
[im 39/86  soft-tissue]
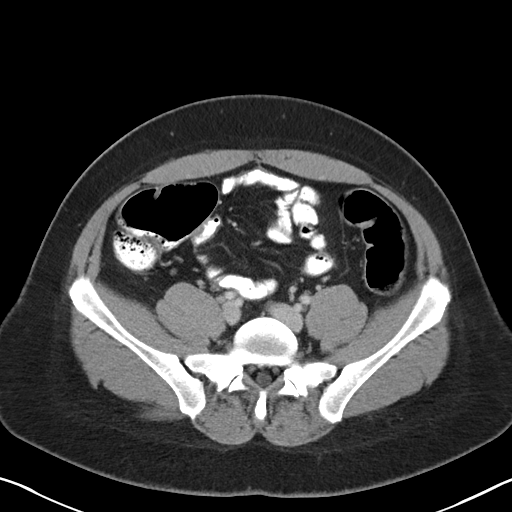
[im 47/86  soft-tissue]
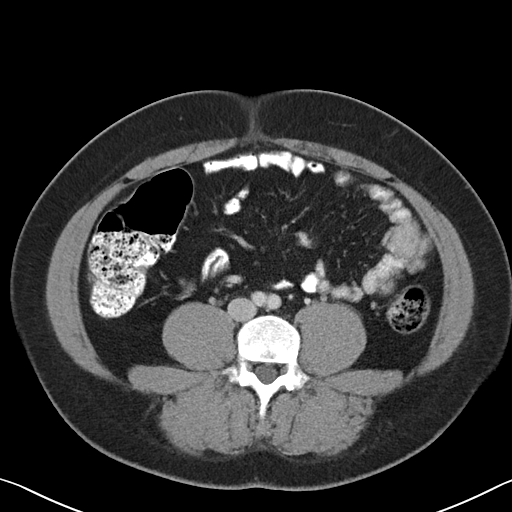
[im 54/86  soft-tissue]
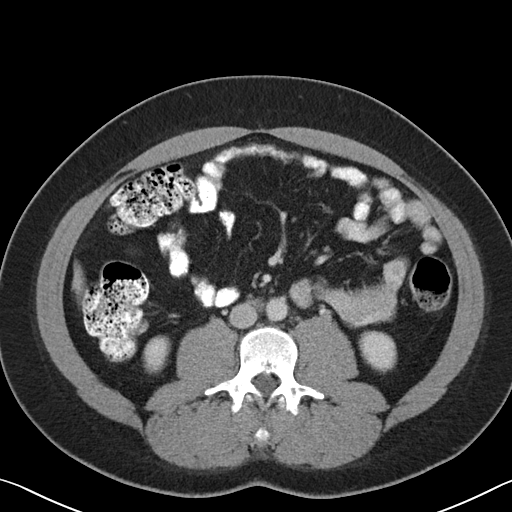
[im 61/86  soft-tissue]
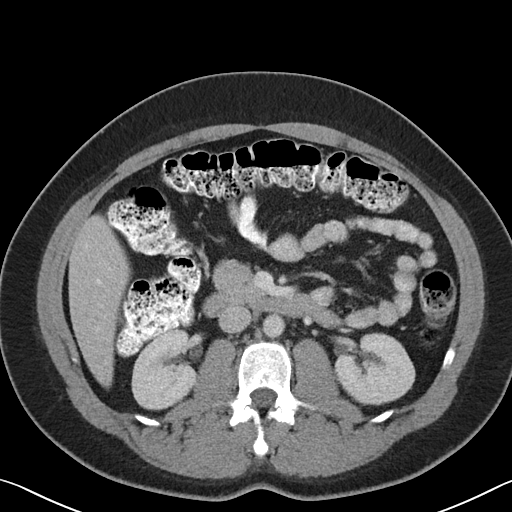
[im 61/86  bone]
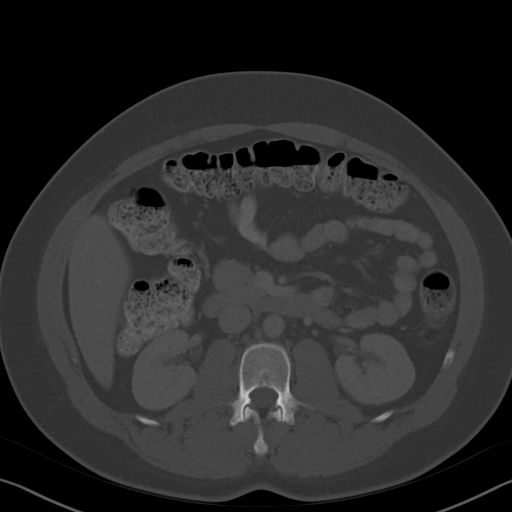
[im 68/86  soft-tissue]
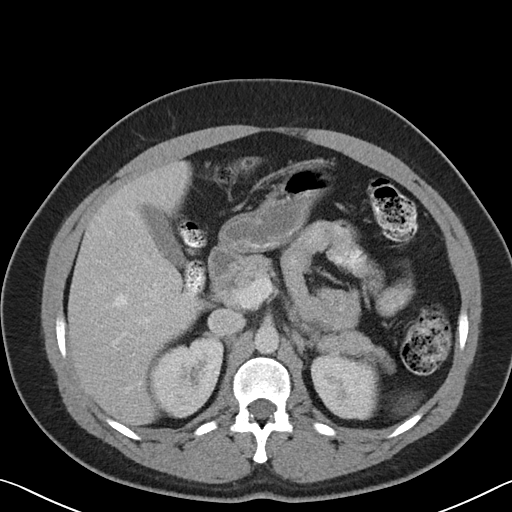
[im 75/86  soft-tissue]
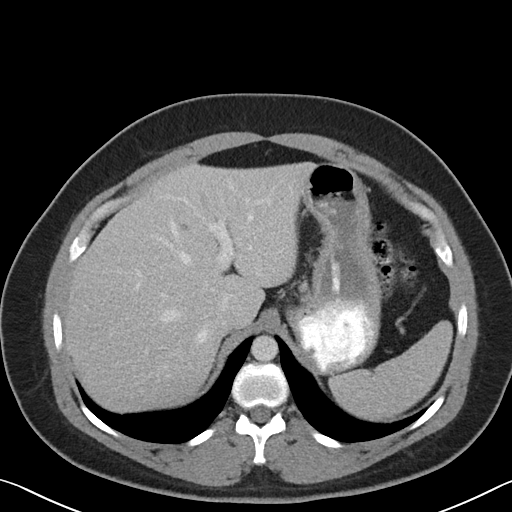
[im 82/86  soft-tissue]
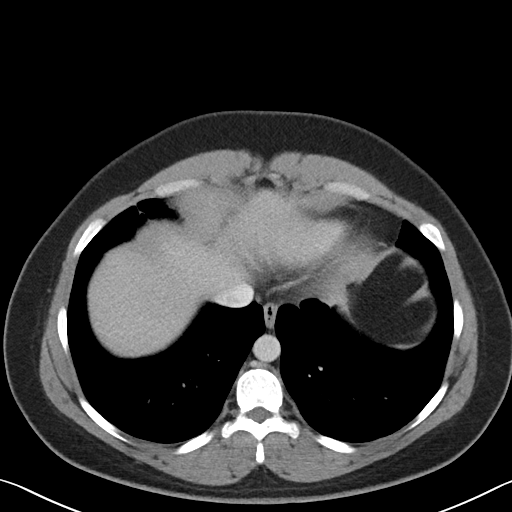

[Series 5: abd/pel w st · coronal · 0.68mm/px · 3 of 81 slices shown]
[im 27/81  soft-tissue]
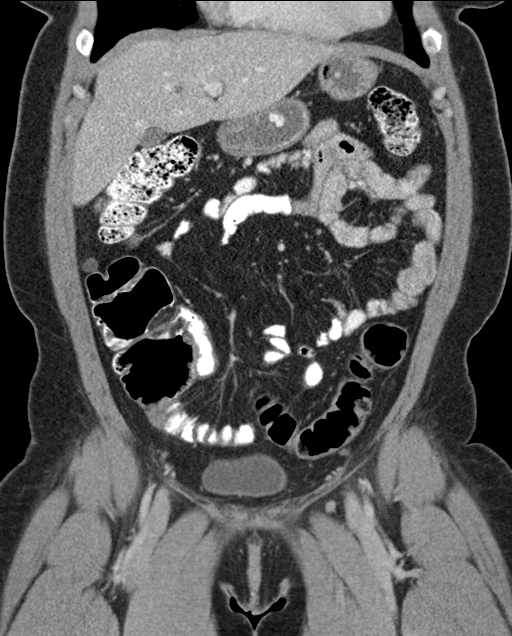
[im 36/81  soft-tissue]
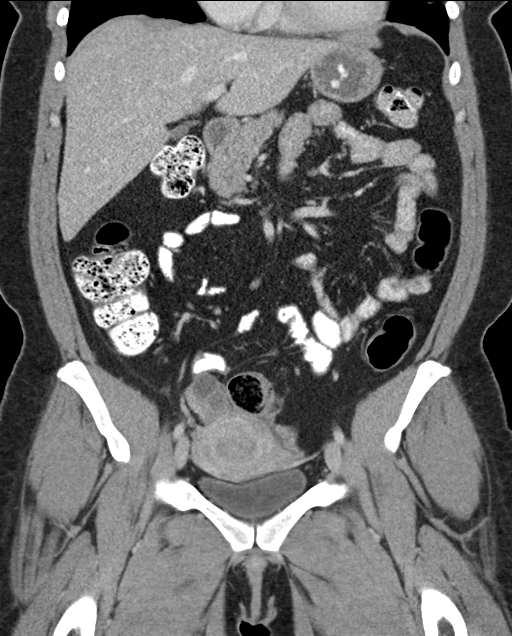
[im 45/81  soft-tissue]
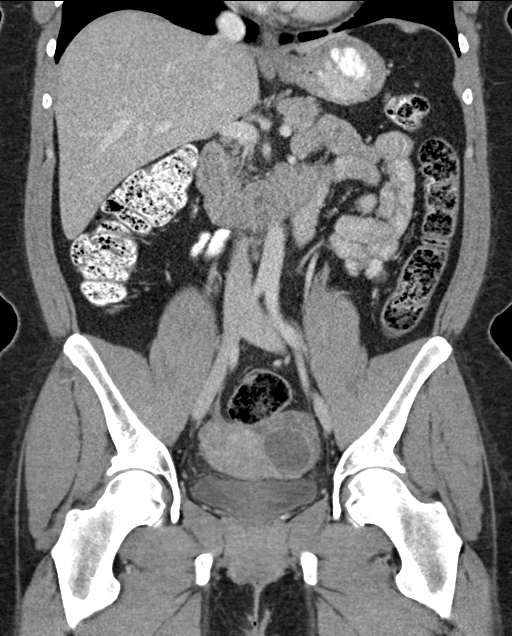

[15 of 46 positions shown; findings below may reference images not displayed]

FINDINGS: Lower chest: Stable appearance of the lung bases with mild chronic
scarring in the right middle lobe and lingula. No pleural or
pericardial effusion.

Hepatobiliary: The liver is normal in density without suspicious
focal abnormality. A previously demonstrated small cystic lesion in
the left lobe has decreased in size (image [DATE]). No evidence of
gallstones, surrounding inflammation or biliary dilatation.

Pancreas: Unremarkable. No pancreatic ductal dilatation or
surrounding inflammatory changes.

Spleen: Normal in size without focal abnormality.

Adrenals/Urinary Tract: Both adrenal glands appear normal. No
evidence of urinary tract calculus or hydronephrosis. Both kidneys
appear normal. The bladder appears normal.

Stomach/Bowel: No evidence of bowel wall thickening, distention or
surrounding inflammatory change. There is stable chronic thickening
of the appendix to 10 mm on image 58/2. No surrounding inflammatory
change or fluid collection. A moderate amount of stool is present
throughout the colon.

Vascular/Lymphatic: There are no enlarged abdominal or pelvic lymph
nodes. No significant vascular findings.

Reproductive: There is a complex left adnexal lesion measuring up to
5.8 cm on sagittal image 58/6. This contains a 4.0 cm low-density
component on image 61/2. Previously, there was a 2.2 cm lesion in
this general area. Mild thickening of the endometrium, within
physiologic limits.

Other: Low-density peritoneal nodule lateral to the right colon
measuring 16 mm on image 36/2 is unchanged, likely a small inclusion
cyst. There is no ascites or other peritoneal nodularity.

Musculoskeletal: No acute or significant osseous findings.
IMPRESSION: 1. No evidence of urinary tract calculus or hydronephrosis. The
kidneys, ureters and bladder appear normal.
2. Enlarging complex left adnexal lesion, potentially a complex cyst
or endometrioma. Consider further evaluation with pelvic ultrasound
especially if this could contribute to the patient's symptoms.
3. Stable mild enlargement of the appendix and right lateral
peritoneal nodule from 6407, consistent with benign findings.

## 2021-03-16 ENCOUNTER — Encounter: Payer: Self-pay | Admitting: Gastroenterology

## 2021-03-16 ENCOUNTER — Ambulatory Visit (INDEPENDENT_AMBULATORY_CARE_PROVIDER_SITE_OTHER): Payer: No Typology Code available for payment source | Admitting: Gastroenterology

## 2021-03-16 VITALS — BP 131/90 | HR 76 | Ht 64.0 in | Wt 200.2 lb

## 2021-03-16 DIAGNOSIS — R1032 Left lower quadrant pain: Secondary | ICD-10-CM | POA: Diagnosis not present

## 2021-03-16 DIAGNOSIS — R109 Unspecified abdominal pain: Secondary | ICD-10-CM | POA: Diagnosis not present

## 2021-03-16 DIAGNOSIS — R1011 Right upper quadrant pain: Secondary | ICD-10-CM | POA: Diagnosis not present

## 2021-03-16 NOTE — Patient Instructions (Signed)
You have been scheduled for an abdominal ultrasound at [facility]iology (1st floor of hospital) on 03/21/21 at 8:30am. Please arrive 15 minutes prior to your appointment for registration. Make certain not to have anything to eat or drink 6 hours prior to your appointment. Should you need to reschedule your appointment, please contact radiology at [phone]. This test typically takes about 30 minutes to perform.  You have been scheduled for an endoscopy. Please follow written instructions given to you at your visit today. If you use inhalers (even only as needed), please bring them with you on the day of your procedure.  Due to recent changes in healthcare laws, you may see the results of your imaging and laboratory studies on MyChart before your provider has had a chance to review them.  We understand that in some cases there may be results that are confusing or concerning to you. Not all laboratory results come back in the same time frame and the provider may be waiting for multiple results in order to interpret others.  Please give [place] 48 hours in order for your provider to thoroughly review all the results before contacting the office for clarification of your results.   The [facility] providers would like to encourage you to use [facility] to communicate with providers for non-urgent requests or questions.  Due to long hold times on the telephone, sending your provider a message by [facility] may be a faster and more efficient way to get a response.  Please allow 48 business hours for a response.  Please remember that this is for non-urgent requests.   Thank you for choosing me and [facility].  Pricilla Riffle. Dagoberto Ligas., MD., [facility]

## 2021-03-16 NOTE — Progress Notes (Signed)
    History of Present Illness: This is a 35 year old female who complains of persistent right upper quadrant and right flank pain.  In addition she has left lower quadrant pain.  She was evaluated for the same complaints in 2021.  CT AP was performed in August 2021.  Prior to that CTAP was performed in August 2019.  No gastrointestinal cause was uncovered.  She has had ongoing follow-up with her GYN, Linda Hedges, DO, for a left ovarian cyst, possible left hydrosalpinx, possible endometrioma.  Surgery has been recommended and the patient has not yet decided to proceed.  She relates her right upper quadrant and right flank pain has gradually worsened over the past year.  It is present most days.  It worsens during her menstrual cycle along with her left-sided abdominal pain.  Her symptoms are not impacted by meals or bowel movements.  Her RUQ and right flank symptoms are worsened by movement and bending. Denies weight loss, constipation, diarrhea, change in stool caliber, melena, hematochezia, nausea, vomiting, dysphagia, reflux symptoms, chest pain.   Current Medications, Allergies, Past Medical History, Past Surgical History, Family History and Social History were reviewed in [employer] record.   Physical Exam: General: Well developed, well nourished, no acute distress Head: Normocephalic and atraumatic Eyes: Sclerae anicteric, EOMI Ears: Normal auditory acuity Mouth: Not examined, mask on during Covid-19 pandemic Lungs: Clear throughout to auscultation Heart: Regular rate and rhythm; no murmurs, rubs or bruits Abdomen: Soft, mild tenderness along right flank and mid right abdomen and non distended. No masses, hepatosplenomegaly or hernias noted. Normal Bowel sounds Rectal: Not done Musculoskeletal: Symmetrical with no gross deformities  Pulses:  Normal pulses noted Extremities: No clubbing, cyanosis, edema or deformities noted Neurological: Alert oriented x 4, grossly  nonfocal Psychological:  Alert and cooperative. Normal mood and affect   Assessment and Recommendations:  RUQ pain, right flank pain. Rule out underlying gastrointestinal etiologies however this seems unlikely.  Musculoskeletal pain, radicular pain and possibly endometriosis related pain seem most likely.  Schedule abdominal ultrasound and EGD to complete the GI evaluation. The risks (including bleeding, perforation, infection, missed lesions, medication reactions and possible hospitalization or surgery if complications occur), benefits, and alternatives to endoscopy with possible biopsy and possible dilation were discussed with the patient and they consent to proceed.   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 LLQ pain.  Follow-up with Linda Hedges, DO as planned.

## 2021-03-21 ENCOUNTER — Ambulatory Visit (HOSPITAL_COMMUNITY): Payer: No Typology Code available for payment source

## 2021-03-31 ENCOUNTER — Ambulatory Visit (HOSPITAL_COMMUNITY)
Admission: RE | Admit: 2021-03-31 | Discharge: 2021-03-31 | Disposition: A | Discharge status: Home / Self Care | Payer: No Typology Code available for payment source | Source: Ambulatory Visit | Attending: Gastroenterology | Admitting: Gastroenterology

## 2021-03-31 ENCOUNTER — Other Ambulatory Visit: Payer: Self-pay

## 2021-03-31 DIAGNOSIS — R1011 Right upper quadrant pain: Secondary | ICD-10-CM | POA: Insufficient documentation

## 2021-04-03 ENCOUNTER — Encounter: Payer: Self-pay | Admitting: Nurse Practitioner

## 2021-04-03 ENCOUNTER — Other Ambulatory Visit: Payer: Self-pay

## 2021-04-03 ENCOUNTER — Ambulatory Visit (INDEPENDENT_AMBULATORY_CARE_PROVIDER_SITE_OTHER): Payer: No Typology Code available for payment source | Admitting: Nurse Practitioner

## 2021-04-03 VITALS — BP 147/87 | HR 65 | Temp 97.7°F | Ht 64.0 in | Wt 202.6 lb

## 2021-04-03 DIAGNOSIS — I10 Essential (primary) hypertension: Secondary | ICD-10-CM | POA: Diagnosis not present

## 2021-04-03 DIAGNOSIS — R7989 Other specified abnormal findings of blood chemistry: Secondary | ICD-10-CM | POA: Diagnosis not present

## 2021-04-03 MED ORDER — AMLODIPINE BESYLATE 5 MG PO TABS
5.0000 mg | ORAL_TABLET | Freq: Every day | ORAL | 3 refills | Status: DC
Start: 2021-04-03 — End: 2021-11-06

## 2021-04-03 NOTE — Patient Instructions (Signed)
Please remember to monitor blood pressure  Managing Your Hypertension Hypertension, also called high blood pressure, is when the force of the blood pressing against the walls of the arteries is too strong. Arteries are blood vessels that carry blood from your heart throughout your body. Hypertension forces the heart to work harder to pump blood and may cause the arteries to become narrow or stiff. Understanding blood pressure readings Your personal target blood pressure may vary depending on your medical conditions, your age, and other factors. A blood pressure reading includes a higher number over a lower number. Ideally, your blood pressure should be below 120/80. You should know that: The first, or top, number is called the systolic pressure. It is a measure of the pressure in your arteries as your heart beats. The second, or bottom number, is called the diastolic pressure. It is a measure of the pressure in your arteries as the heart relaxes. Blood pressure is classified into four stages. Based on your blood pressure reading, your health care provider may use the following stages to determine what type of treatment you need, if any. Systolic pressure and diastolic pressure are measured in a unit called mmHg. Normal Systolic pressure: below 188. Diastolic pressure: below 80. Elevated Systolic pressure: 416-606. Diastolic pressure: below 80. Hypertension stage 1 Systolic pressure: 301-601. Diastolic pressure: 09-32. Hypertension stage 2 Systolic pressure: 355 or above. Diastolic pressure: 90 or above. How can this condition affect me? Managing your hypertension is an important responsibility. Over time, hypertension can damage the arteries and decrease blood flow to important parts of the body, including the brain, heart, and kidneys. Having untreated or uncontrolled hypertension can lead to: A heart attack. A stroke. A weakened blood vessel (aneurysm). Heart failure. Kidney damage. Eye  damage. Metabolic syndrome. Memory and concentration problems. Vascular dementia. What actions can I take to manage this condition? Hypertension can be managed by making lifestyle changes and possibly by taking medicines. Your health care provider will help you make a plan to bring your blood pressure within a normal range. Nutrition  Eat a diet that is high in fiber and potassium, and low in salt (sodium), added sugar, and fat. An example eating plan is called the Dietary Approaches to Stop Hypertension (DASH) diet. To eat this way: Eat plenty of fresh fruits and vegetables. Try to fill one-half of your plate at each meal with fruits and vegetables. Eat whole grains, such as whole-wheat pasta, brown rice, or whole-grain bread. Fill about one-fourth of your plate with whole grains. Eat low-fat dairy products. Avoid fatty cuts of meat, processed or cured meats, and poultry with skin. Fill about one-fourth of your plate with lean proteins such as fish, chicken without skin, beans, eggs, and tofu. Avoid pre-made and processed foods. These tend to be higher in sodium, added sugar, and fat. Reduce your daily sodium intake. Most people with hypertension should eat less than 1,500 mg of sodium a day. Lifestyle  Work with your health care provider to maintain a healthy body weight or to lose weight. Ask what an ideal weight is for you. Get at least 30 minutes of exercise that causes your heart to beat faster (aerobic exercise) most days of the week. Activities may include walking, swimming, or biking. Include exercise to strengthen your muscles (resistance exercise), such as weight lifting, as part of your weekly exercise routine. Try to do these types of exercises for 30 minutes at least 3 days a week. Do not use any products that contain  nicotine or tobacco, such as cigarettes, e-cigarettes, and chewing tobacco. If you need help quitting, ask your health care provider. Control any long-term (chronic)  conditions you have, such as high cholesterol or diabetes. Identify your sources of stress and find ways to manage stress. This may include meditation, deep breathing, or making time for fun activities. Alcohol use Do not drink alcohol if: Your health care provider tells you not to drink. You are pregnant, may be pregnant, or are planning to become pregnant. If you drink alcohol: Limit how much you use to: 0-1 drink a day for women. 0-2 drinks a day for men. Be aware of how much alcohol is in your drink. In the [place] one drink equals one 12 oz bottle of beer (355 mL), one 5 oz glass of wine (148 mL), or one 1 oz glass of hard liquor (44 mL). Medicines Your health care provider may prescribe medicine if lifestyle changes are not enough to get your blood pressure under control and if: Your systolic blood pressure is 130 or higher. Your diastolic blood pressure is 80 or higher. Take medicines only as told by your health care provider. Follow the directions carefully. Blood pressure medicines must be taken as told by your health care provider. The medicine does not work as well when you skip doses. Skipping doses also puts you at risk for problems. Monitoring Before you monitor your blood pressure: Do not smoke, drink caffeinated beverages, or exercise within 30 minutes before taking a measurement. Use the bathroom and empty your bladder (urinate). Sit quietly for at least 5 minutes before taking measurements. Monitor your blood pressure at home as told by your health care provider. To do this: Sit with your back straight and supported. Place your feet flat on the floor. Do not cross your legs. Support your arm on a flat surface, such as a table. Make sure your upper arm is at heart level. Each time you measure, take two or three readings one minute apart and record the results. You may also need to have your blood pressure checked regularly by your health care provider. General  information Talk with your health care provider about your diet, exercise habits, and other lifestyle factors that may be contributing to hypertension. Review all the medicines you take with your health care provider because there may be side effects or interactions. Keep all visits as told by your health care provider. Your health care provider can help you create and adjust your plan for managing your high blood pressure. Where to find more information National Heart, Lung, and Blood Institute: https://wilson-eaton.com/ American Heart Association: www.heart.org Contact a health care provider if: You think you are having a reaction to medicines you have taken. You have repeated (recurrent) headaches. You feel dizzy. You have swelling in your ankles. You have trouble with your vision. Get help right away if: You develop a severe headache or confusion. You have unusual weakness or numbness, or you feel faint. You have severe pain in your chest or abdomen. You vomit repeatedly. You have trouble breathing. These symptoms may represent a serious problem that is an emergency. Do not wait to see if the symptoms will go away. Get medical help right away. Call your local emergency services (911 in the [place] Do not drive yourself to the hospital. Summary Hypertension is when the force of blood pumping through your arteries is too strong. If this condition is not controlled, it may put you at risk for serious complications. Your personal  target blood pressure may vary depending on your medical conditions, your age, and other factors. For most people, a normal blood pressure is less than 120/80. Hypertension is managed by lifestyle changes, medicines, or both. Lifestyle changes to help manage hypertension include losing weight, eating a healthy, low-sodium diet, exercising more, stopping smoking, and limiting alcohol. This information is not intended to replace advice given to you by your health care  provider. Make sure you discuss any questions you have with your health care provider. Document Revised: 06/01/2019 Document Reviewed: 04/14/2019 Elsevier Patient Education  2022 [employer].

## 2021-04-03 NOTE — Progress Notes (Signed)
[place],   [address] Phone:  [phone]   Fax:  [phone]   Established Patient Office Visit  Subjective:  Patient ID: Donna Bradshaw, female    DOB: July 09, 1985  Age: 35 y.o. MRN: [id number]  CC:  Chief Complaint  Patient presents with   Follow-up    Pt is here today for her follow up visit.  Pt would like to discuss adjusting mer blood pressure medication due to her blood pressure still being elevated.    HPI Donna Bradshaw presents for follow-up. She  has a past medical history of Hyperthyroidism, Migraine, and Uterine fibroid.   She has hypertension is currently on propranolol ER which was used for migraines but due to the dual properties dose adjustments have been made to also help lower blood pressure.  She does monitor her blood pressure at home and it ranges in the 140s to high 80s to 90s. Denies headache, dizziness, visual changes, shortness of breath, dyspnea on exertion, chest pain, nausea, vomiting or any edema.  She does work full-time.  She has maintained her weight even with going on several vacations.  Past Medical History:  Diagnosis Date   Hyperthyroidism    no longer an issue   Migraine    Uterine fibroid     Past Surgical History:  Procedure Laterality Date   MYOMECTOMY N/A 03/05/2017   Procedure: MYOMECTOMY, ABDOMINAL;  Surgeon: Linda Hedges, DO;  Location: [facility];  Service: Gynecology;  Laterality: N/A;   NO PAST SURGERIES     WISDOM TOOTH EXTRACTION      Family History  Problem Relation Age of Onset   Diabetes Maternal Uncle    Diabetes Maternal Grandmother    Cancer Other    Seizures Other    Breast cancer Paternal Grandmother    Migraines Neg Hx    Colon cancer Neg Hx    Stomach cancer Neg Hx    Pancreatic cancer Neg Hx    Esophageal cancer Neg Hx     Social History   Socioeconomic History   Marital status: Single    Spouse name: Not on file   Number of children: 0   Years of  education: 12+   Highest education level: Not on file  Occupational History   Occupation: Pharmacy tech  Tobacco Use   Smoking status: Never   Smokeless tobacco: Never  Vaping Use   Vaping Use: Never used  Substance and Sexual Activity   Alcohol use: Yes    Alcohol/week: 0.0 standard drinks    Comment: occ   Drug use: No   Sexual activity: Yes    Birth control/protection: None  Other Topics Concern   Not on file  Social History Narrative   Lives w/ sister.   Caffeine use: 1-2 times per week   Right handed   Social Determinants of Health   Financial Resource Strain: Not on file  Food Insecurity: Not on file  Transportation Needs: Not on file  Physical Activity: Not on file  Stress: Not on file  Social Connections: Not on file  Intimate Partner Violence: Not on file    Outpatient Medications Prior to Visit  Medication Sig Dispense Refill   cyclobenzaprine (FLEXERIL) 10 MG tablet Take 1 tablet (10 mg total) by mouth at bedtime. And headaches. 90 tablet 0   ibuprofen (ADVIL,MOTRIN) 200 MG tablet Take 600-800 mg by mouth as needed (for cramps).     LARIN 24 FE 1-20 MG-MCG(24)  tablet Take 1 tablet by mouth daily.     Multiple Vitamin (MULTIVITAMIN) capsule Take by mouth.     naproxen sodium (ALEVE) 220 MG tablet Take 440 mg by mouth as needed.     naratriptan (AMERGE) 2.5 MG tablet TAKE 1 TABLET AT ONSET OF HEADACHE. MAY REPEAT ONCE IN 2 HOURS AS NEEDED FOR MIGRAINE 9 tablet 11   triamcinolone cream (KENALOG) 0.5 % APPLY CREAM TOPICALLY TO AFFECTED AREA 4 TIMES DAILY     Vitamin D, Cholecalciferol, 25 MCG (1000 UT) TABS Take by mouth. 2,000 units daily     propranolol ER (INDERAL LA) 160 MG SR capsule Take 1 capsule by mouth once daily 30 capsule 11   No facility-administered medications prior to visit.    No Known Allergies  ROS Review of Systems    Objective:    Physical Exam Constitutional:      Appearance: She is obese.  HENT:     Head: Normocephalic and  atraumatic.  Cardiovascular:     Rate and Rhythm: Normal rate and regular rhythm.     Pulses: Normal pulses.     Heart sounds: Normal heart sounds.  Pulmonary:     Effort: Pulmonary effort is normal.     Breath sounds: Normal breath sounds.  Abdominal:     Palpations: Abdomen is soft.  Musculoskeletal:        General: Normal range of motion.     Right lower leg: No edema.     Left lower leg: No edema.  Skin:    General: Skin is warm.     Capillary Refill: Capillary refill takes less than 2 seconds.  Neurological:     General: No focal deficit present.     Mental Status: She is alert and oriented to person, place, and time.  Psychiatric:        Mood and Affect: Mood normal.        Behavior: Behavior normal.        Thought Content: Thought content normal.        Judgment: Judgment normal.    BP (!) 147/87   Pulse 65   Temp 97.7 F (36.5 C)   Ht 5\' 4"  (1.626 m)   Wt 202 lb 9.6 oz (91.9 kg)   LMP 04/03/2021 (Exact Date)   SpO2 100%   BMI 34.78 kg/m  Wt Readings from Last 3 Encounters:  04/03/21 202 lb 9.6 oz (91.9 kg)  03/16/21 200 lb 4 oz (90.8 kg)  07/14/20 211 lb (95.7 kg)     Health Maintenance Due  Topic Date Due   PAP SMEAR-Modifier  03/24/2018   COVID-19 Vaccine (3 - Booster for Pfizer series) 10/02/2019   INFLUENZA VACCINE  12/26/2020    There are no preventive care reminders to display for this patient.  Lab Results  Component Value Date   TSH 4.970 (H) 04/14/2020   Lab Results  Component Value Date   WBC 4.9 04/14/2020   HGB 11.3 04/14/2020   HCT 34.1 04/14/2020   MCV 83 04/14/2020   PLT 364 04/14/2020   Lab Results  Component Value Date   NA 138 04/14/2020   K 4.5 04/14/2020   CO2 24 02/25/2017   GLUCOSE 104 (H) 04/14/2020   BUN 11 04/14/2020   CREATININE 1.12 (H) 04/14/2020   BILITOT 0.5 04/14/2020   ALKPHOS 49 04/14/2020   AST 17 04/14/2020   ALT 17 02/25/2017   PROT 6.7 04/14/2020   ALBUMIN 4.3 04/14/2020   CALCIUM  9.3  04/14/2020   ANIONGAP 6 02/25/2017   Lab Results  Component Value Date   CHOL 132 09/10/2019   Lab Results  Component Value Date   HDL 51 09/10/2019   Lab Results  Component Value Date   LDLCALC 70 09/10/2019   Lab Results  Component Value Date   TRIG 47 09/10/2019   Lab Results  Component Value Date   CHOLHDL 2.6 09/10/2019   Lab Results  Component Value Date   HGBA1C 5.4 09/10/2019      Assessment & Plan:   Problem List Items Addressed This Visit       Cardiovascular and Mediastinum   Benign essential HTN - Primary Persistent Started amlodipine 5 mg daily She can continue the propanolol it is due to indication for migraine treatment Encouraged on going compliance with current medication regimen Encouraged home monitoring and recording BP <130/80 Eating a heart-healthy diet with less salt Encouraged regular physical activity  Recommend Weight loss     Relevant Medications   amLODipine (NORVASC) 5 MG tablet   Other Relevant Orders   Comp. Metabolic Panel (12)     Other   Abnormal TSH History of elevated thyroid will further evaluate   Relevant Orders   TSH   T3   T4, free    Meds ordered this encounter  Medications   amLODipine (NORVASC) 5 MG tablet    Sig: Take 1 tablet (5 mg total) by mouth daily.    Dispense:  90 tablet    Refill:  3    Order Specific Question:   Supervising Provider    Answer:   Tresa Garter [id number]    Follow-up: Return in about 4 weeks (around 05/01/2021) for Follow up HTN [id number] virtual visit.    Vevelyn Francois, NP

## 2021-04-04 LAB — COMP. METABOLIC PANEL (12)
AST: 15 IU/L (ref 0–40)
Albumin/Globulin Ratio: 1.3 (ref 1.2–2.2)
Albumin: 3.9 g/dL (ref 3.8–4.8)
Alkaline Phosphatase: 46 IU/L (ref 44–121)
BUN/Creatinine Ratio: 9 (ref 9–23)
BUN: 10 mg/dL (ref 6–20)
Bilirubin Total: 0.4 mg/dL (ref 0.0–1.2)
Calcium: 9.4 mg/dL (ref 8.7–10.2)
Chloride: 102 mmol/L (ref 96–106)
Creatinine, Ser: 1.11 mg/dL — ABNORMAL HIGH (ref 0.57–1.00)
Globulin, Total: 2.9 g/dL (ref 1.5–4.5)
Glucose: 81 mg/dL (ref 70–99)
Potassium: 4.1 mmol/L (ref 3.5–5.2)
Sodium: 137 mmol/L (ref 134–144)
Total Protein: 6.8 g/dL (ref 6.0–8.5)
eGFR: 66 mL/min/{1.73_m2} (ref >59)

## 2021-04-04 LAB — T4, FREE: Free T4: 0.97 ng/dL (ref 0.82–1.77)

## 2021-04-04 LAB — TSH: TSH: 8.63 u[IU]/mL — ABNORMAL HIGH (ref 0.450–4.500)

## 2021-04-04 LAB — T3: T3, Total: 148 ng/dL (ref 71–180)

## 2021-04-18 ENCOUNTER — Other Ambulatory Visit: Payer: Self-pay | Admitting: Gastroenterology

## 2021-04-18 ENCOUNTER — Ambulatory Visit (AMBULATORY_SURGERY_CENTER): Payer: No Typology Code available for payment source | Admitting: Gastroenterology

## 2021-04-18 ENCOUNTER — Encounter: Payer: Self-pay | Admitting: Gastroenterology

## 2021-04-18 VITALS — BP 115/70 | HR 86 | Temp 98.9°F | Resp 18 | Ht 64.0 in | Wt 200.0 lb

## 2021-04-18 DIAGNOSIS — B9681 Helicobacter pylori [H. pylori] as the cause of diseases classified elsewhere: Secondary | ICD-10-CM | POA: Diagnosis not present

## 2021-04-18 DIAGNOSIS — R1032 Left lower quadrant pain: Secondary | ICD-10-CM

## 2021-04-18 DIAGNOSIS — K2970 Gastritis, unspecified, without bleeding: Secondary | ICD-10-CM

## 2021-04-18 DIAGNOSIS — R1011 Right upper quadrant pain: Secondary | ICD-10-CM

## 2021-04-18 DIAGNOSIS — K2950 Unspecified chronic gastritis without bleeding: Secondary | ICD-10-CM | POA: Diagnosis not present

## 2021-04-18 DIAGNOSIS — R109 Unspecified abdominal pain: Secondary | ICD-10-CM

## 2021-04-18 MED ORDER — SODIUM CHLORIDE 0.9 % IV SOLN: 500.0000 mL | Freq: Once | INTRAVENOUS | Status: DC

## 2021-04-18 NOTE — Progress Notes (Signed)
History & Physical  Primary Care Physician:  Vevelyn Francois, NP Primary Gastroenterologist: Lucio Edward, MD  CHIEF COMPLAINT:  RUQ pain, right flank pain, LLQ pain   HPI: Donna Bradshaw is a 35 y.o. female with RUQ pain, right flank pain, LLQ pain for EGD.    Past Medical History:  Diagnosis Date   Asthma    Hypertension    Hyperthyroidism    no longer an issue   Migraine    Uterine fibroid     Past Surgical History:  Procedure Laterality Date   MYOMECTOMY N/A 03/05/2017   Procedure: MYOMECTOMY, ABDOMINAL;  Surgeon: Linda Hedges, DO;  Location: [facility];  Service: Gynecology;  Laterality: N/A;   NO PAST SURGERIES     WISDOM TOOTH EXTRACTION      Prior to Admission medications   Medication Sig Start Date End Date Taking? Authorizing Provider  amLODipine (NORVASC) 5 MG tablet Take 1 tablet (5 mg total) by mouth daily. 04/03/21 04/03/22 Yes King, Diona Foley, NP  ibuprofen (ADVIL,MOTRIN) 200 MG tablet Take 600-800 mg by mouth as needed (for cramps).   Yes [provider]  LARIN 24 FE 1-20 MG-MCG(24) tablet Take 1 tablet by mouth daily. 04/02/21  Yes [provider]  Multiple Vitamin (MULTIVITAMIN) capsule Take by mouth.   Yes [provider]  triamcinolone cream (KENALOG) 0.5 % APPLY CREAM TOPICALLY TO AFFECTED AREA 4 TIMES DAILY 08/14/19  Yes [provider]  Vitamin D, Cholecalciferol, 25 MCG (1000 UT) TABS Take by mouth. 2,000 units daily   Yes [provider]  cyclobenzaprine (FLEXERIL) 10 MG tablet Take 1 tablet (10 mg total) by mouth at bedtime. And headaches. 07/14/20   Lomax, Amy, NP  naproxen sodium (ALEVE) 220 MG tablet Take 440 mg by mouth as needed.    [provider]  naratriptan (AMERGE) 2.5 MG tablet TAKE 1 TABLET AT ONSET OF HEADACHE. MAY REPEAT ONCE IN 2 HOURS AS NEEDED FOR MIGRAINE 07/14/20   Lomax, Amy, NP    Current Outpatient Medications  Medication Sig Dispense Refill   amLODipine (NORVASC) 5 MG tablet  Take 1 tablet (5 mg total) by mouth daily. 90 tablet 3   ibuprofen (ADVIL,MOTRIN) 200 MG tablet Take 600-800 mg by mouth as needed (for cramps).     LARIN 24 FE 1-20 MG-MCG(24) tablet Take 1 tablet by mouth daily.     Multiple Vitamin (MULTIVITAMIN) capsule Take by mouth.     triamcinolone cream (KENALOG) 0.5 % APPLY CREAM TOPICALLY TO AFFECTED AREA 4 TIMES DAILY     Vitamin D, Cholecalciferol, 25 MCG (1000 UT) TABS Take by mouth. 2,000 units daily     cyclobenzaprine (FLEXERIL) 10 MG tablet Take 1 tablet (10 mg total) by mouth at bedtime. And headaches. 90 tablet 0   naproxen sodium (ALEVE) 220 MG tablet Take 440 mg by mouth as needed.     naratriptan (AMERGE) 2.5 MG tablet TAKE 1 TABLET AT ONSET OF HEADACHE. MAY REPEAT ONCE IN 2 HOURS AS NEEDED FOR MIGRAINE 9 tablet 11   Current Facility-Administered Medications  Medication Dose Route Frequency Provider Last Rate Last Admin   0.9 %  sodium chloride infusion  500 mL Intravenous Once Ladene Artist, MD        Allergies as of 04/18/2021   (No Known Allergies)    Family History  Problem Relation Age of Onset   Diabetes Maternal Uncle    Diabetes Maternal Grandmother    Breast cancer Paternal Grandmother  Cancer Other    Seizures Other    Migraines Neg Hx    Colon cancer Neg Hx    Stomach cancer Neg Hx    Pancreatic cancer Neg Hx    Esophageal cancer Neg Hx    Rectal cancer Neg Hx     Social History   Socioeconomic History   Marital status: Single    Spouse name: Not on file   Number of children: 0   Years of education: 12+   Highest education level: Not on file  Occupational History   Occupation: Pharmacy tech  Tobacco Use   Smoking status: Never   Smokeless tobacco: Never  Vaping Use   Vaping Use: Never used  Substance and Sexual Activity   Alcohol use: Yes    Alcohol/week: 0.0 standard drinks    Comment: occ   Drug use: No   Sexual activity: Yes    Birth control/protection: None  Other Topics Concern   Not  on file  Social History Narrative   Lives w/ sister.   Caffeine use: 1-2 times per week   Right handed   Social Determinants of Health   Financial Resource Strain: Not on file  Food Insecurity: Not on file  Transportation Needs: Not on file  Physical Activity: Not on file  Stress: Not on file  Social Connections: Not on file  Intimate Partner Violence: Not on file    Review of Systems:  All systems reviewed an negative except where noted in HPI.  Gen: Denies any fever, chills, sweats, anorexia, fatigue, weakness, malaise, weight loss, and sleep disorder CV: Denies chest pain, angina, palpitations, syncope, orthopnea, PND, peripheral edema, and claudication. Resp: Denies dyspnea at rest, dyspnea with exercise, cough, sputum, wheezing, coughing up blood, and pleurisy. GI: Denies vomiting blood, jaundice, and fecal incontinence.   Denies dysphagia or odynophagia. GU : Denies urinary burning, blood in urine, urinary frequency, urinary hesitancy, nocturnal urination, and urinary incontinence. MS: Denies joint pain, limitation of movement, and swelling, stiffness, low back pain, extremity pain. Denies muscle weakness, cramps, atrophy.  Derm: Denies rash, itching, dry skin, hives, moles, warts, or unhealing ulcers.  Psych: Denies depression, anxiety, memory loss, suicidal ideation, hallucinations, paranoia, and confusion. Heme: Denies bruising, bleeding, and enlarged lymph nodes. Neuro:  Denies any headaches, dizziness, paresthesias. Endo:  Denies any problems with DM, thyroid, adrenal function.   Physical Exam: General:  Alert, well-developed, in NAD Head:  Normocephalic and atraumatic. Eyes:  Sclera clear, no icterus.   Conjunctiva pink. Ears:  Normal auditory acuity. Mouth:  No deformity or lesions.  Neck:  Supple; no masses . Lungs:  Clear throughout to auscultation.   No wheezes, crackles, or rhonchi. No acute distress. Heart:  Regular rate and rhythm; no murmurs. Abdomen:   Soft, nondistended, nontender. No masses, hepatomegaly. No obvious masses.  Normal bowel .    Rectal:  Deferred   Msk:  Symmetrical without gross deformities.. Pulses:  Normal pulses noted. Extremities:  Without edema. Neurologic:  Alert and  oriented x4;  grossly normal neurologically. Skin:  Intact without significant lesions or rashes. Cervical Nodes:  No significant cervical adenopathy. Psych:  Alert and cooperative. Normal mood and affect.   Impression / Plan:  RUQ pain, right flank pain, LLQ pain for EGD.     Pricilla Riffle. Fuller Plan  04/18/2021, 10:09 AM See Shea Evans, [facility], to contact our on call provider

## 2021-04-18 NOTE — Progress Notes (Signed)
Called to room to assist during endoscopic procedure.  Patient ID and intended procedure confirmed with present staff. Received instructions for my participation in the procedure from the performing physician.  

## 2021-04-18 NOTE — Patient Instructions (Signed)
YOU HAD AN ENDOSCOPIC PROCEDURE TODAY AT THE [facility]:   Refer to the procedure report that was given to you for any specific questions about what was found during the examination.  If the procedure report does not answer your questions, please call your gastroenterologist to clarify.  If you requested that your care partner not be given the details of your procedure findings, then the procedure report has been included in a sealed envelope for you to review at your convenience later.  YOU SHOULD EXPECT: Some feelings of bloating in the abdomen. Passage of more gas than usual.  Walking can help get rid of the air that was put into your GI tract during the procedure and reduce the bloating. If you had a lower endoscopy (such as a colonoscopy or flexible sigmoidoscopy) you may notice spotting of blood in your stool or on the toilet paper. If you underwent a bowel prep for your procedure, you may not have a normal bowel movement for a few days.  Please Note:  You might notice some irritation and congestion in your nose or some drainage.  This is from the oxygen used during your procedure.  There is no need for concern and it should clear up in a day or so.  SYMPTOMS TO REPORT IMMEDIATELY:    Following upper endoscopy (EGD)  Vomiting of blood or coffee ground material  New chest pain or pain under the shoulder blades  Painful or persistently difficult swallowing  New shortness of breath  Fever of 100F or higher  Black, tarry-looking stools  For urgent or emergent issues, a gastroenterologist can be reached at any hour by calling [phone]. Do not use MyChart messaging for urgent concerns.    DIET:  We do recommend a small meal at first, but then you may proceed to your regular diet.  Drink plenty of fluids but you should avoid alcoholic beverages for 24 hours.  ACTIVITY:  You should plan to take it easy for the rest of today and you should NOT DRIVE or use heavy machinery  until tomorrow (because of the sedation medicines used during the test).    FOLLOW UP: Our staff will call the number listed on your records 48-72 hours following your procedure to check on you and address any questions or concerns that you may have regarding the information given to you following your procedure. If we do not reach you, we will leave a message.  We will attempt to reach you two times.  During this call, we will ask if you have developed any symptoms of COVID 19. If you develop any symptoms (ie: fever, flu-like symptoms, shortness of breath, cough etc.) before then, please call [phone].  If you test positive for Covid 19 in the 2 weeks post procedure, please call and report this information to us.    If any biopsies were taken you will be contacted by phone or by letter within the next 1-3 weeks.  Please call us at [phone] if you have not heard about the biopsies in 3 weeks.    SIGNATURES/CONFIDENTIALITY: You and/or your care partner have signed paperwork which will be entered into your electronic medical record.  These signatures attest to the fact that that the information above on your After Visit Summary has been reviewed and is understood.  Full responsibility of the confidentiality of this discharge information lies with you and/or your care-partner. 

## 2021-04-18 NOTE — Progress Notes (Signed)
1005 Robinul 0.1 mg IV given due large amount of secretions upon assessment.  MD made aware, vss 

## 2021-04-18 NOTE — Op Note (Signed)
[facility] Patient Name: Donna Bradshaw Procedure Date: 04/18/2021 10:06 AM MRN: [id number] Endoscopist: Ladene Artist , MD Age: 35 Referring MD:  Date of Birth: [birth year] Gender: Female Account #: [id number] Procedure:                Upper GI endoscopy Indications:              Abdominal pain in the right upper quadrant, in the                            right flank, in the left upper quadrant, in the                            left lower quadrant Medicines:                Monitored Anesthesia Care Procedure:                Pre-Anesthesia Assessment:                           - Prior to the procedure, a History and Physical                            was performed, and patient medications and                            allergies were reviewed. The patient's tolerance of                            previous anesthesia was also reviewed. The risks                            and benefits of the procedure and the sedation                            options and risks were discussed with the patient.                            All questions were answered, and informed consent                            was obtained. Prior Anticoagulants: The patient has                            taken no previous anticoagulant or antiplatelet                            agents. ASA Grade Assessment: II - A patient with                            mild systemic disease. After reviewing the risks                            and benefits, the patient was deemed in  satisfactory condition to undergo the procedure.                           After obtaining informed consent, the endoscope was                            passed under direct vision. Throughout the                            procedure, the patient's blood pressure, pulse, and                            oxygen saturations were monitored continuously. The                            GIF [id number] #[id number] was introduced  through the                            mouth, and advanced to the second part of duodenum.                            The upper GI endoscopy was accomplished without                            difficulty. The patient tolerated the procedure                            well. Scope In: Scope Out: Findings:                 The examined esophagus was normal.                           Patchy mildly erythematous mucosa without bleeding                            was found in the gastric body and in the gastric                            antrum. Biopsies were taken with a cold forceps for                            histology.                           The exam of the stomach was otherwise normal.                           The duodenal bulb and second portion of the                            duodenum were normal. Complications:            No immediate complications. Estimated Blood Loss:     Estimated blood loss was minimal. Impression:               -  Normal esophagus.                           - Erythematous mucosa in the gastric body and                            antrum. Biopsied.                           - Normal duodenal bulb and second portion of the                            duodenum. Recommendation:           - Patient has a contact number available for                            emergencies. The signs and symptoms of potential                            delayed complications were discussed with the                            patient. Return to normal activities tomorrow.                            Written discharge instructions were provided to the                            patient.                           - Resume previous diet.                           - Continue present medications.                           - Await pathology results.                           - No GI cause for her symptoms was found. Recommend                            follow up with GYN and PCP. Ladene Artist, MD 04/18/2021 10:31:38 AM This report has been signed electronically.

## 2021-04-18 NOTE — Progress Notes (Signed)
VS taken by C.W. 

## 2021-04-18 NOTE — Progress Notes (Signed)
Report given to PACU, vss 

## 2021-04-24 ENCOUNTER — Telehealth: Payer: Self-pay | Admitting: *Deleted

## 2021-04-24 ENCOUNTER — Telehealth: Payer: Self-pay

## 2021-04-24 NOTE — Telephone Encounter (Signed)
  Follow up Call-  Call back number 04/18/2021  Post procedure Call Back phone  # [phone]  Permission to leave phone message Yes  Some recent data might be hidden     Patient questions: Message left to call us if necessary.

## 2021-04-24 NOTE — Telephone Encounter (Signed)
Attempted f/u call. No answer, left VM. 

## 2021-05-01 ENCOUNTER — Telehealth (INDEPENDENT_AMBULATORY_CARE_PROVIDER_SITE_OTHER): Payer: No Typology Code available for payment source | Admitting: Nurse Practitioner

## 2021-05-01 DIAGNOSIS — I10 Essential (primary) hypertension: Secondary | ICD-10-CM

## 2021-05-02 ENCOUNTER — Other Ambulatory Visit: Payer: Self-pay

## 2021-05-02 ENCOUNTER — Other Ambulatory Visit (HOSPITAL_COMMUNITY): Payer: Self-pay

## 2021-05-02 DIAGNOSIS — A048 Other specified bacterial intestinal infections: Secondary | ICD-10-CM

## 2021-05-02 MED ORDER — TALICIA 250-12.5-10 MG PO CPDR
4.0000 | DELAYED_RELEASE_CAPSULE | Freq: Three times a day (TID) | ORAL | 0 refills | Status: AC
  Filled 2021-05-02 – 2021-05-04 (×2): qty 168, 14d supply, fill #0

## 2021-05-03 ENCOUNTER — Telehealth: Payer: Self-pay | Admitting: Gastroenterology

## 2021-05-03 ENCOUNTER — Other Ambulatory Visit (HOSPITAL_COMMUNITY): Payer: Self-pay

## 2021-05-03 NOTE — Telephone Encounter (Signed)
Patient returned your call.

## 2021-05-03 NOTE — Telephone Encounter (Signed)
See results notes for details.  

## 2021-05-04 ENCOUNTER — Other Ambulatory Visit (HOSPITAL_COMMUNITY): Payer: Self-pay

## 2021-05-05 ENCOUNTER — Other Ambulatory Visit (HOSPITAL_COMMUNITY): Payer: Self-pay

## 2021-05-05 ENCOUNTER — Telehealth (INDEPENDENT_AMBULATORY_CARE_PROVIDER_SITE_OTHER): Payer: No Typology Code available for payment source | Admitting: Nurse Practitioner

## 2021-05-05 ENCOUNTER — Other Ambulatory Visit: Payer: Self-pay

## 2021-05-05 DIAGNOSIS — A048 Other specified bacterial intestinal infections: Secondary | ICD-10-CM

## 2021-05-05 DIAGNOSIS — I10 Essential (primary) hypertension: Secondary | ICD-10-CM | POA: Diagnosis not present

## 2021-05-05 NOTE — Patient Instructions (Signed)
Managing Your Hypertension Hypertension, also called high blood pressure, is when the force of the blood pressing against the walls of the arteries is too strong. Arteries are blood vessels that carry blood from your heart throughout your body. Hypertension forces the heart to work harder to pump blood and may cause the arteries to become narrow or stiff. Understanding blood pressure readings Your personal target blood pressure may vary depending on your medical conditions, your age, and other factors. A blood pressure reading includes a higher number over a lower number. Ideally, your blood pressure should be below 120/80. You should know that: The first, or top, number is called the systolic pressure. It is a measure of the pressure in your arteries as your heart beats. The second, or bottom number, is called the diastolic pressure. It is a measure of the pressure in your arteries as the heart relaxes. Blood pressure is classified into four stages. Based on your blood pressure reading, your health care provider may use the following stages to determine what type of treatment you need, if any. Systolic pressure and diastolic pressure are measured in a unit called mmHg. Normal Systolic pressure: below 120. Diastolic pressure: below 80. Elevated Systolic pressure: 120-129. Diastolic pressure: below 80. Hypertension stage 1 Systolic pressure: 130-139. Diastolic pressure: 80-89. Hypertension stage 2 Systolic pressure: 140 or above. Diastolic pressure: 90 or above. How can this condition affect me? Managing your hypertension is an important responsibility. Over time, hypertension can damage the arteries and decrease blood flow to important parts of the body, including the brain, heart, and kidneys. Having untreated or uncontrolled hypertension can lead to: A heart attack. A stroke. A weakened blood vessel (aneurysm). Heart failure. Kidney damage. Eye damage. Metabolic syndrome. Memory and  concentration problems. Vascular dementia. What actions can I take to manage this condition? Hypertension can be managed by making lifestyle changes and possibly by taking medicines. Your health care provider will help you make a plan to bring your blood pressure within a normal range. Nutrition  Eat a diet that is high in fiber and potassium, and low in salt (sodium), added sugar, and fat. An example eating plan is called the Dietary Approaches to Stop Hypertension (DASH) diet. To eat this way: Eat plenty of fresh fruits and vegetables. Try to fill one-half of your plate at each meal with fruits and vegetables. Eat whole grains, such as whole-wheat pasta, brown rice, or whole-grain bread. Fill about one-fourth of your plate with whole grains. Eat low-fat dairy products. Avoid fatty cuts of meat, processed or cured meats, and poultry with skin. Fill about one-fourth of your plate with lean proteins such as fish, chicken without skin, beans, eggs, and tofu. Avoid pre-made and processed foods. These tend to be higher in sodium, added sugar, and fat. Reduce your daily sodium intake. Most people with hypertension should eat less than 1,500 mg of sodium a day. Lifestyle  Work with your health care provider to maintain a healthy body weight or to lose weight. Ask what an ideal weight is for you. Get at least 30 minutes of exercise that causes your heart to beat faster (aerobic exercise) most days of the week. Activities may include walking, swimming, or biking. Include exercise to strengthen your muscles (resistance exercise), such as weight lifting, as part of your weekly exercise routine. Try to do these types of exercises for 30 minutes at least 3 days a week. Do not use any products that contain nicotine or tobacco, such as cigarettes, e-cigarettes,   and chewing tobacco. If you need help quitting, ask your health care provider. Control any long-term (chronic) conditions you have, such as high  cholesterol or diabetes. Identify your sources of stress and find ways to manage stress. This may include meditation, deep breathing, or making time for fun activities. Alcohol use Do not drink alcohol if: Your health care provider tells you not to drink. You are pregnant, may be pregnant, or are planning to become pregnant. If you drink alcohol: Limit how much you use to: 0-1 drink a day for women. 0-2 drinks a day for men. Be aware of how much alcohol is in your drink. In the [place] one drink equals one 12 oz bottle of beer (355 mL), one 5 oz glass of wine (148 mL), or one 1 oz glass of hard liquor (44 mL). Medicines Your health care provider may prescribe medicine if lifestyle changes are not enough to get your blood pressure under control and if: Your systolic blood pressure is 130 or higher. Your diastolic blood pressure is 80 or higher. Take medicines only as told by your health care provider. Follow the directions carefully. Blood pressure medicines must be taken as told by your health care provider. The medicine does not work as well when you skip doses. Skipping doses also puts you at risk for problems. Monitoring Before you monitor your blood pressure: Do not smoke, drink caffeinated beverages, or exercise within 30 minutes before taking a measurement. Use the bathroom and empty your bladder (urinate). Sit quietly for at least 5 minutes before taking measurements. Monitor your blood pressure at home as told by your health care provider. To do this: Sit with your back straight and supported. Place your feet flat on the floor. Do not cross your legs. Support your arm on a flat surface, such as a table. Make sure your upper arm is at heart level. Each time you measure, take two or three readings one minute apart and record the results. You may also need to have your blood pressure checked regularly by your health care provider. General information Talk with your health care  provider about your diet, exercise habits, and other lifestyle factors that may be contributing to hypertension. Review all the medicines you take with your health care provider because there may be side effects or interactions. Keep all visits as told by your health care provider. Your health care provider can help you create and adjust your plan for managing your high blood pressure. Where to find more information National Heart, Lung, and Blood Institute: www.nhlbi.nih.gov American Heart Association: www.heart.org Contact a health care provider if: You think you are having a reaction to medicines you have taken. You have repeated (recurrent) headaches. You feel dizzy. You have swelling in your ankles. You have trouble with your vision. Get help right away if: You develop a severe headache or confusion. You have unusual weakness or numbness, or you feel faint. You have severe pain in your chest or abdomen. You vomit repeatedly. You have trouble breathing. These symptoms may represent a serious problem that is an emergency. Do not wait to see if the symptoms will go away. Get medical help right away. Call your local emergency services (911 in the [place] Do not drive yourself to the hospital. Summary Hypertension is when the force of blood pumping through your arteries is too strong. If this condition is not controlled, it may put you at risk for serious complications. Your personal target blood pressure may vary depending on   your medical conditions, your age, and other factors. For most people, a normal blood pressure is less than 120/80. Hypertension is managed by lifestyle changes, medicines, or both. Lifestyle changes to help manage hypertension include losing weight, eating a healthy, low-sodium diet, exercising more, stopping smoking, and limiting alcohol. This information is not intended to replace advice given to you by your health care provider. Make sure you discuss any questions  you have with your health care provider. Document Revised: 06/01/2019 Document Reviewed: 04/14/2019 Elsevier Patient Education  2022 [employer]  

## 2021-05-05 NOTE — Progress Notes (Signed)
   [place] [address] [place], [place]  [address] Phone:  [phone]   Fax:  [phone] Virtual Visit via Video Note  I connected with Donna Bradshaw on 05/06/21 at  3:40 PM EST by video and verified that I am speaking with the correct person using two identifiers.   I discussed the limitations, risks, security and privacy concerns of performing an evaluation and management service by video and the availability of in person appointments. I also discussed with the patient that there may be a patient responsible charge related to this service. The patient expressed understanding and agreed to proceed.  Patient home Provider Office  History of Present Illness:  has a past medical history of Asthma, Hypertension, Hyperthyroidism, Migraine, and Uterine fibroid.   HPI  She is following up for her elevated BP. She is prescribed Amlodipine 5 mg daily. She reports being compliant with her medication. She noticed headaches the first few days but this resolved. She feels like the Amlodipine  has been effective overall. Her BP has been in the 120-80's. 129/87 This am 125/84.  She reports following up with GI and being diagnosed with H pylori. She was prescribed Talicia. She has not started this due to being concern with some of the side effects ; Lupus. She report her current symptom is bloating. She denies any nausea or vomiting.    ROS    Observations/Objective: Virtual visit no exam   Assessment and Plan: 1. Benign essential HTN Improving  Encouraged on going compliance with current medication regimen Encouraged home monitoring and recording BP <130/80 Eating a heart-healthy diet with less salt Encouraged regular physical activity  Recommend Weight loss    2. H. pylori infection Encourage treatment as directed and follow up with any side effects.   Follow Up Instructions: Return in about 3 months (around 08/03/2021) for Follow up HTN 41660.    I  discussed the assessment and treatment plan with the patient. The patient was provided an opportunity to ask questions and all were answered. The patient agreed with the plan and demonstrated an understanding of the instructions.   The patient was advised to call back or seek an in-person evaluation if the symptoms worsen or if the condition fails to improve as anticipated.  I provided 10 minutes of video- visit time during this encounter.   Vevelyn Francois, NP

## 2021-05-06 ENCOUNTER — Encounter: Payer: Self-pay | Admitting: Nurse Practitioner

## 2021-05-09 NOTE — Progress Notes (Signed)
duplicate

## 2021-06-30 ENCOUNTER — Other Ambulatory Visit: Payer: Self-pay | Admitting: Nurse Practitioner

## 2021-06-30 MED ORDER — CYCLOBENZAPRINE HCL 10 MG PO TABS: 10.0000 mg | ORAL_TABLET | Freq: Every day | ORAL | 0 refills | Status: DC

## 2021-06-30 MED ORDER — NARATRIPTAN HCL 2.5 MG PO TABS: ORAL_TABLET | ORAL | 3 refills | Status: DC

## 2021-08-04 ENCOUNTER — Ambulatory Visit (INDEPENDENT_AMBULATORY_CARE_PROVIDER_SITE_OTHER): Payer: No Typology Code available for payment source | Admitting: Nurse Practitioner

## 2021-08-04 ENCOUNTER — Encounter: Payer: Self-pay | Admitting: Nurse Practitioner

## 2021-08-04 ENCOUNTER — Other Ambulatory Visit: Payer: Self-pay | Admitting: Gastroenterology

## 2021-08-04 ENCOUNTER — Other Ambulatory Visit: Payer: Self-pay

## 2021-08-04 ENCOUNTER — Other Ambulatory Visit: Payer: No Typology Code available for payment source

## 2021-08-04 VITALS — BP 133/93 | HR 89 | Temp 98.0°F | Ht 64.0 in | Wt 201.6 lb

## 2021-08-04 DIAGNOSIS — R7989 Other specified abnormal findings of blood chemistry: Secondary | ICD-10-CM

## 2021-08-04 DIAGNOSIS — I10 Essential (primary) hypertension: Secondary | ICD-10-CM | POA: Diagnosis not present

## 2021-08-04 DIAGNOSIS — A048 Other specified bacterial intestinal infections: Secondary | ICD-10-CM

## 2021-08-04 NOTE — Progress Notes (Incomplete)
[place], [place]  [address] Phone:  [phone]   Fax:  [phone]   Established Patient Office Visit  Subjective:  Patient ID: Donna Bradshaw, female    DOB: 01/08/1986  Age: 36 y.o. MRN: [id number]  CC:  Chief Complaint  Patient presents with   Follow-up    Pt is here for follow up. No issues or concerns    HPI Donna Bradshaw presents for follow up. She  has a past medical history of Asthma, Hypertension, Hyperthyroidism, Migraine, and Uterine fibroid.   Donna Bradshaw is in today for follow up for Hypertension. The current prescribed treatment is Amlopdine 5 mg Compliance is reported and home blood pressure monitoring is done .The  DASH diet is being followed. An exercise regimen is not ongoing. There is a goal to maintain adequate BP. Denies  dizziness, visual changes, shortness of breath, dyspnea on exertion, chest pain, nausea, vomiting or any edema.   She continues to have the right sided pain. She has not had a repeat hyplori testing. She has seen the GYN. She is having the pain constantly now. She endorse previously it was in  Past Medical History:  Diagnosis Date   Asthma    Hypertension    Hyperthyroidism    no longer an issue   Migraine    Uterine fibroid     Past Surgical History:  Procedure Laterality Date   MYOMECTOMY N/A 03/05/2017   Procedure: MYOMECTOMY, ABDOMINAL;  Surgeon: Linda Hedges, DO;  Location: [facility];  Service: Gynecology;  Laterality: N/A;   NO PAST SURGERIES     WISDOM TOOTH EXTRACTION      Family History  Problem Relation Age of Onset   Diabetes Maternal Uncle    Diabetes Maternal Grandmother    Breast cancer Paternal Grandmother    Cancer Other    Seizures Other    Migraines Neg Hx    Colon cancer Neg Hx    Stomach cancer Neg Hx    Pancreatic cancer Neg Hx    Esophageal cancer Neg Hx    Rectal cancer Neg Hx     Social History   Socioeconomic History   Marital  status: Single    Spouse name: Not on file   Number of children: 0   Years of education: 12+   Highest education level: Not on file  Occupational History   Occupation: Pharmacy tech  Tobacco Use   Smoking status: Never   Smokeless tobacco: Never  Vaping Use   Vaping Use: Never used  Substance and Sexual Activity   Alcohol use: Yes    Alcohol/week: 0.0 standard drinks    Comment: occ   Drug use: No   Sexual activity: Yes    Birth control/protection: None  Other Topics Concern   Not on file  Social History Narrative   Lives w/ sister.   Caffeine use: 1-2 times per week   Right handed   Social Determinants of Health   Financial Resource Strain: Not on file  Food Insecurity: Not on file  Transportation Needs: Not on file  Physical Activity: Not on file  Stress: Not on file  Social Connections: Not on file  Intimate Partner Violence: Not on file    Outpatient Medications Prior to Visit  Medication Sig Dispense Refill   amLODipine (NORVASC) 5 MG tablet Take 1 tablet (5 mg total) by mouth daily. 90 tablet 3   cyclobenzaprine (FLEXERIL) 10 MG tablet Take 1 tablet (  10 mg total) by mouth at bedtime. And headaches. 90 tablet 0   ibuprofen (ADVIL,MOTRIN) 200 MG tablet Take 600-800 mg by mouth as needed (for cramps).     LARIN 24 FE 1-20 MG-MCG(24) tablet Take 1 tablet by mouth daily.     Multiple Vitamin (MULTIVITAMIN) capsule Take by mouth.     naproxen sodium (ALEVE) 220 MG tablet Take 440 mg by mouth as needed.     naratriptan (AMERGE) 2.5 MG tablet TAKE 1 TABLET AT ONSET OF HEADACHE. MAY REPEAT ONCE IN 2 HOURS AS NEEDED FOR MIGRAINE 9 tablet 3   triamcinolone cream (KENALOG) 0.5 % APPLY CREAM TOPICALLY TO AFFECTED AREA 4 TIMES DAILY     Vitamin D, Cholecalciferol, 25 MCG (1000 UT) TABS Take by mouth. 2,000 units daily     No facility-administered medications prior to visit.    No Known Allergies  ROS Review of Systems    Objective:    Physical  Exam Constitutional:      Appearance: She is obese.  HENT:     Head: Normocephalic and atraumatic.  Cardiovascular:     Rate and Rhythm: Normal rate and regular rhythm.     Pulses: Normal pulses.     Heart sounds: Normal heart sounds.  Pulmonary:     Effort: Pulmonary effort is normal.     Breath sounds: Normal breath sounds.  Abdominal:     Palpations: Abdomen is soft.  Musculoskeletal:        General: Normal range of motion.     Right lower leg: No edema.     Left lower leg: No edema.  Skin:    General: Skin is warm.     Capillary Refill: Capillary refill takes less than 2 seconds.  Neurological:     General: No focal deficit present.     Mental Status: She is alert and oriented to person, place, and time.  Psychiatric:        Mood and Affect: Mood normal.        Behavior: Behavior normal.        Thought Content: Thought content normal.        Judgment: Judgment normal.    BP (!) 133/93 (BP Location: Left Arm, Cuff Size: Normal)    Pulse 89    Temp 98 F (36.7 C)    Ht 5' 4"  (1.626 m)    Wt 201 lb 9.6 oz (91.4 kg)    LMP 07/21/2021    SpO2 100%    BMI 34.60 kg/m  Wt Readings from Last 3 Encounters:  08/04/21 201 lb 9.6 oz (91.4 kg)  04/18/21 200 lb (90.7 kg)  04/03/21 202 lb 9.6 oz (91.9 kg)     Health Maintenance Due  Topic Date Due   PAP SMEAR-Modifier  03/24/2018   COVID-19 Vaccine (3 - Booster for Pfizer series) 10/02/2019   INFLUENZA VACCINE  12/26/2020    There are no preventive care reminders to display for this patient.  Lab Results  Component Value Date   TSH 8.630 (H) 04/03/2021   Lab Results  Component Value Date   WBC 4.9 04/14/2020   HGB 11.3 04/14/2020   HCT 34.1 04/14/2020   MCV 83 04/14/2020   PLT 364 04/14/2020   Lab Results  Component Value Date   NA 137 04/03/2021   K 4.1 04/03/2021   CO2 24 02/25/2017   GLUCOSE 81 04/03/2021   BUN 10 04/03/2021   CREATININE 1.11 (H) 04/03/2021   BILITOT 0.4 04/03/2021  ALKPHOS 46  04/03/2021   AST 15 04/03/2021   ALT 17 02/25/2017   PROT 6.8 04/03/2021   ALBUMIN 3.9 04/03/2021   CALCIUM 9.4 04/03/2021   ANIONGAP 6 02/25/2017   EGFR 66 04/03/2021   Lab Results  Component Value Date   CHOL 132 09/10/2019   Lab Results  Component Value Date   HDL 51 09/10/2019   Lab Results  Component Value Date   LDLCALC 70 09/10/2019   Lab Results  Component Value Date   TRIG 47 09/10/2019   Lab Results  Component Value Date   CHOLHDL 2.6 09/10/2019   Lab Results  Component Value Date   HGBA1C 5.4 09/10/2019      Assessment & Plan:   Problem List Items Addressed This Visit       Cardiovascular and Mediastinum   Benign essential HTN - Primary    No orders of the defined types were placed in this encounter.   Follow-up: No follow-ups on file.    Vevelyn Francois, NP

## 2021-08-04 NOTE — Patient Instructions (Signed)
Managing Your Hypertension Hypertension, also called high blood pressure, is when the force of the blood pressing against the walls of the arteries is too strong. Arteries are blood vessels that carry blood from your heart throughout your body. Hypertension forces the heart to work harder to pump blood and may cause the arteries to become narrow or stiff. Understanding blood pressure readings Your personal target blood pressure may vary depending on your medical conditions, your age, and other factors. A blood pressure reading includes a higher number over a lower number. Ideally, your blood pressure should be below 120/80. You should know that: The first, or top, number is called the systolic pressure. It is a measure of the pressure in your arteries as your heart beats. The second, or bottom number, is called the diastolic pressure. It is a measure of the pressure in your arteries as the heart relaxes. Blood pressure is classified into four stages. Based on your blood pressure reading, your health care provider may use the following stages to determine what type of treatment you need, if any. Systolic pressure and diastolic pressure are measured in a unit called mmHg. Normal Systolic pressure: below 120. Diastolic pressure: below 80. Elevated Systolic pressure: 120-129. Diastolic pressure: below 80. Hypertension stage 1 Systolic pressure: 130-139. Diastolic pressure: 80-89. Hypertension stage 2 Systolic pressure: 140 or above. Diastolic pressure: 90 or above. How can this condition affect me? Managing your hypertension is an important responsibility. Over time, hypertension can damage the arteries and decrease blood flow to important parts of the body, including the brain, heart, and kidneys. Having untreated or uncontrolled hypertension can lead to: A heart attack. A stroke. A weakened blood vessel (aneurysm). Heart failure. Kidney damage. Eye damage. Metabolic syndrome. Memory and  concentration problems. Vascular dementia. What actions can I take to manage this condition? Hypertension can be managed by making lifestyle changes and possibly by taking medicines. Your health care provider will help you make a plan to bring your blood pressure within a normal range. Nutrition  Eat a diet that is high in fiber and potassium, and low in salt (sodium), added sugar, and fat. An example eating plan is called the Dietary Approaches to Stop Hypertension (DASH) diet. To eat this way: Eat plenty of fresh fruits and vegetables. Try to fill one-half of your plate at each meal with fruits and vegetables. Eat whole grains, such as whole-wheat pasta, brown rice, or whole-grain bread. Fill about one-fourth of your plate with whole grains. Eat low-fat dairy products. Avoid fatty cuts of meat, processed or cured meats, and poultry with skin. Fill about one-fourth of your plate with lean proteins such as fish, chicken without skin, beans, eggs, and tofu. Avoid pre-made and processed foods. These tend to be higher in sodium, added sugar, and fat. Reduce your daily sodium intake. Most people with hypertension should eat less than 1,500 mg of sodium a day. Lifestyle  Work with your health care provider to maintain a healthy body weight or to lose weight. Ask what an ideal weight is for you. Get at least 30 minutes of exercise that causes your heart to beat faster (aerobic exercise) most days of the week. Activities may include walking, swimming, or biking. Include exercise to strengthen your muscles (resistance exercise), such as weight lifting, as part of your weekly exercise routine. Try to do these types of exercises for 30 minutes at least 3 days a week. Do not use any products that contain nicotine or tobacco, such as cigarettes, e-cigarettes,   and chewing tobacco. If you need help quitting, ask your health care provider. Control any long-term (chronic) conditions you have, such as high  cholesterol or diabetes. Identify your sources of stress and find ways to manage stress. This may include meditation, deep breathing, or making time for fun activities. Alcohol use Do not drink alcohol if: Your health care provider tells you not to drink. You are pregnant, may be pregnant, or are planning to become pregnant. If you drink alcohol: Limit how much you use to: 0-1 drink a day for women. 0-2 drinks a day for men. Be aware of how much alcohol is in your drink. In the [place] one drink equals one 12 oz bottle of beer (355 mL), one 5 oz glass of wine (148 mL), or one 1 oz glass of hard liquor (44 mL). Medicines Your health care provider may prescribe medicine if lifestyle changes are not enough to get your blood pressure under control and if: Your systolic blood pressure is 130 or higher. Your diastolic blood pressure is 80 or higher. Take medicines only as told by your health care provider. Follow the directions carefully. Blood pressure medicines must be taken as told by your health care provider. The medicine does not work as well when you skip doses. Skipping doses also puts you at risk for problems. Monitoring Before you monitor your blood pressure: Do not smoke, drink caffeinated beverages, or exercise within 30 minutes before taking a measurement. Use the bathroom and empty your bladder (urinate). Sit quietly for at least 5 minutes before taking measurements. Monitor your blood pressure at home as told by your health care provider. To do this: Sit with your back straight and supported. Place your feet flat on the floor. Do not cross your legs. Support your arm on a flat surface, such as a table. Make sure your upper arm is at heart level. Each time you measure, take two or three readings one minute apart and record the results. You may also need to have your blood pressure checked regularly by your health care provider. General information Talk with your health care  provider about your diet, exercise habits, and other lifestyle factors that may be contributing to hypertension. Review all the medicines you take with your health care provider because there may be side effects or interactions. Keep all visits as told by your health care provider. Your health care provider can help you create and adjust your plan for managing your high blood pressure. Where to find more information National Heart, Lung, and Blood Institute: www.nhlbi.nih.gov American Heart Association: www.heart.org Contact a health care provider if: You think you are having a reaction to medicines you have taken. You have repeated (recurrent) headaches. You feel dizzy. You have swelling in your ankles. You have trouble with your vision. Get help right away if: You develop a severe headache or confusion. You have unusual weakness or numbness, or you feel faint. You have severe pain in your chest or abdomen. You vomit repeatedly. You have trouble breathing. These symptoms may represent a serious problem that is an emergency. Do not wait to see if the symptoms will go away. Get medical help right away. Call your local emergency services (911 in the [place] Do not drive yourself to the hospital. Summary Hypertension is when the force of blood pumping through your arteries is too strong. If this condition is not controlled, it may put you at risk for serious complications. Your personal target blood pressure may vary depending on   your medical conditions, your age, and other factors. For most people, a normal blood pressure is less than 120/80. Hypertension is managed by lifestyle changes, medicines, or both. Lifestyle changes to help manage hypertension include losing weight, eating a healthy, low-sodium diet, exercising more, stopping smoking, and limiting alcohol. This information is not intended to replace advice given to you by your health care provider. Make sure you discuss any questions  you have with your health care provider. Document Revised: 06/01/2019 Document Reviewed: 04/14/2019 Elsevier Patient Education  2022 [employer]  

## 2021-08-05 LAB — T4, FREE: Free T4: 0.89 ng/dL (ref 0.82–1.77)

## 2021-08-05 LAB — T3: T3, Total: 176 ng/dL (ref 71–180)

## 2021-08-05 LAB — TSH: TSH: 5.22 u[IU]/mL — ABNORMAL HIGH (ref 0.450–4.500)

## 2021-08-10 ENCOUNTER — Encounter: Payer: Self-pay | Admitting: Nurse Practitioner

## 2021-08-11 ENCOUNTER — Other Ambulatory Visit: Payer: No Typology Code available for payment source

## 2021-08-11 DIAGNOSIS — A048 Other specified bacterial intestinal infections: Secondary | ICD-10-CM

## 2021-08-14 LAB — HELICOBACTER PYLORI  SPECIAL ANTIGEN
MICRO NUMBER:: 13145920
SPECIMEN QUALITY: ADEQUATE

## 2021-11-06 ENCOUNTER — Encounter: Payer: Self-pay | Admitting: Nurse Practitioner

## 2021-11-06 ENCOUNTER — Ambulatory Visit (INDEPENDENT_AMBULATORY_CARE_PROVIDER_SITE_OTHER): Payer: No Typology Code available for payment source | Admitting: Nurse Practitioner

## 2021-11-06 VITALS — BP 134/92 | HR 81 | Temp 97.6°F | Ht 64.0 in | Wt 195.4 lb

## 2021-11-06 DIAGNOSIS — L308 Other specified dermatitis: Secondary | ICD-10-CM | POA: Diagnosis not present

## 2021-11-06 DIAGNOSIS — I10 Essential (primary) hypertension: Secondary | ICD-10-CM

## 2021-11-06 DIAGNOSIS — R7989 Other specified abnormal findings of blood chemistry: Secondary | ICD-10-CM

## 2021-11-06 MED ORDER — TRIAMCINOLONE ACETONIDE 0.5 % EX CREA: TOPICAL_CREAM | CUTANEOUS | 2 refills | Status: DC

## 2021-11-06 MED ORDER — AMLODIPINE BESYLATE 5 MG PO TABS: 5.0000 mg | ORAL_TABLET | Freq: Every day | ORAL | 3 refills | Status: DC

## 2021-11-06 NOTE — Progress Notes (Signed)
[place], [place]  [address] Phone:  [phone]   Fax:  [phone] Subjective:   Patient ID: Donna Bradshaw, female    DOB: 1985-08-20, 36 y.o.   MRN: [id number]  Chief Complaint  Patient presents with   Follow-up    3 month follow up   HPI Donna Bradshaw 36 y.o. female  has a past medical history of Asthma, Hypertension, Hyperthyroidism, Migraine, and Uterine fibroid. To the [facility] for reevaluation of HTN.  Hypertension: Patient here for follow-up of elevated blood pressure. She is exercising and is adherent to low salt diet.  Denies checking B/P at home. Cardiac symptoms none. Patient denies none.  Cardiovascular risk factors: hypertension and obesity (BMI >= 30 kg/m2). Use of agents associated with hypertension: none. History of target organ damage: none. Denies any other concerns today. Questions whether she needed to have her B/P medication adjusted.   Requesting refill of medication for eczema. Also requested evaluation of TSH, states that it has been elevated in the past.   Denies any fatigue, chest pain, shortness of breath, HA or dizziness. Denies any blurred vision, numbness or tingling.    Past Medical History:  Diagnosis Date   Asthma    Hypertension    Hyperthyroidism    no longer an issue   Migraine    Uterine fibroid     Past Surgical History:  Procedure Laterality Date   MYOMECTOMY N/A 03/05/2017   Procedure: MYOMECTOMY, ABDOMINAL;  Surgeon: Linda Hedges, DO;  Location: [facility];  Service: Gynecology;  Laterality: N/A;   WISDOM TOOTH EXTRACTION      Family History  Problem Relation Age of Onset   Diabetes Maternal Uncle    Diabetes Maternal Grandmother    Breast cancer Paternal Grandmother    Cancer Other    Seizures Other    Migraines Neg Hx    Colon cancer Neg Hx    Stomach cancer Neg Hx    Pancreatic cancer Neg Hx    Esophageal cancer Neg Hx    Rectal cancer Neg Hx     Social History    Socioeconomic History   Marital status: Single    Spouse name: Not on file   Number of children: 0   Years of education: 12+   Highest education level: Not on file  Occupational History   Occupation: Pharmacy tech  Tobacco Use   Smoking status: Never   Smokeless tobacco: Never  Vaping Use   Vaping Use: Never used  Substance and Sexual Activity   Alcohol use: Yes    Alcohol/week: 0.0 standard drinks of alcohol    Comment: occ   Drug use: No   Sexual activity: Yes    Birth control/protection: None  Other Topics Concern   Not on file  Social History Narrative   Lives w/ sister.   Caffeine use: 1-2 times per week   Right handed   Social Determinants of Health   Financial Resource Strain: Not on file  Food Insecurity: Not on file  Transportation Needs: Not on file  Physical Activity: Not on file  Stress: Not on file  Social Connections: Not on file  Intimate Partner Violence: Not on file    Outpatient Medications Prior to Visit  Medication Sig Dispense Refill   cyclobenzaprine (FLEXERIL) 10 MG tablet Take 1 tablet (10 mg total) by mouth at bedtime. And headaches. 90 tablet 0   ibuprofen (ADVIL,MOTRIN) 200 MG tablet Take 600-800 mg by mouth  as needed (for cramps).     LARIN 24 FE 1-20 MG-MCG(24) tablet Take 1 tablet by mouth daily.     Multiple Vitamin (MULTIVITAMIN) capsule Take by mouth.     naproxen sodium (ALEVE) 220 MG tablet Take 440 mg by mouth as needed.     naratriptan (AMERGE) 2.5 MG tablet TAKE 1 TABLET AT ONSET OF HEADACHE. MAY REPEAT ONCE IN 2 HOURS AS NEEDED FOR MIGRAINE 9 tablet 3   Vitamin D, Cholecalciferol, 25 MCG (1000 UT) TABS Take by mouth. 2,000 units daily     amLODipine (NORVASC) 5 MG tablet Take 1 tablet (5 mg total) by mouth daily. 90 tablet 3   triamcinolone cream (KENALOG) 0.5 % APPLY CREAM TOPICALLY TO AFFECTED AREA 4 TIMES DAILY     No facility-administered medications prior to visit.    No Known Allergies  Review of Systems   Constitutional:  Negative for chills, fever and malaise/fatigue.  Respiratory:  Negative for cough and shortness of breath.   Cardiovascular:  Negative for chest pain, palpitations and leg swelling.  Gastrointestinal:  Negative for abdominal pain, blood in stool, constipation, diarrhea, nausea and vomiting.  Skin: Negative.   Neurological: Negative.   Psychiatric/Behavioral:  Negative for depression. The patient is not nervous/anxious.   All other systems reviewed and are negative.      Objective:    Physical Exam Constitutional:      General: She is not in acute distress.    Appearance: Normal appearance. She is obese.  HENT:     Head: Normocephalic.  Neck:     Vascular: No carotid bruit.  Cardiovascular:     Rate and Rhythm: Normal rate and regular rhythm.     Pulses: Normal pulses.     Heart sounds: Normal heart sounds.     Comments: No obvious peripheral edema Pulmonary:     Effort: Pulmonary effort is normal.     Breath sounds: Normal breath sounds.  Musculoskeletal:     Cervical back: Normal range of motion and neck supple. No rigidity or tenderness.  Lymphadenopathy:     Cervical: No cervical adenopathy.  Skin:    General: Skin is warm and dry.     Capillary Refill: Capillary refill takes less than 2 seconds.  Neurological:     General: No focal deficit present.     Mental Status: She is alert and oriented to person, place, and time.  Psychiatric:        Mood and Affect: Mood normal.        Behavior: Behavior normal.        Thought Content: Thought content normal.        Judgment: Judgment normal.     BP (!) 134/92 (BP Location: Right Arm, Patient Position: Sitting, Cuff Size: Normal)   Pulse 81   Temp 97.6 F (36.4 C)   Ht _0  (1.626 m)   Wt 195 lb 6.4 oz (88.6 kg)   SpO2 100%   BMI 33.54 kg/m  Wt Readings from Last 3 Encounters:  11/06/21 195 lb 6.4 oz (88.6 kg)  08/04/21 201 lb 9.6 oz (91.4 kg)  04/18/21 200 lb (90.7 kg)    Immunization  History  Administered Date(s) Administered   PFIZER(Purple Top)SARS-COV-2 Vaccination 07/17/2019, 08/07/2019   Tdap 12/31/2017    Diabetic Foot Exam - Simple   No data filed     Lab Results  Component Value Date   TSH 8.520 (H) 11/06/2021   Lab Results  Component Value Date  WBC 4.9 04/14/2020   HGB 11.3 04/14/2020   HCT 34.1 04/14/2020   MCV 83 04/14/2020   PLT 364 04/14/2020   Lab Results  Component Value Date   NA 137 04/03/2021   K 4.1 04/03/2021   CO2 24 02/25/2017   GLUCOSE 81 04/03/2021   BUN 10 04/03/2021   CREATININE 1.11 (H) 04/03/2021   BILITOT 0.4 04/03/2021   ALKPHOS 46 04/03/2021   AST 15 04/03/2021   ALT 17 02/25/2017   PROT 6.8 04/03/2021   ALBUMIN 3.9 04/03/2021   CALCIUM 9.4 04/03/2021   ANIONGAP 6 02/25/2017   EGFR 66 04/03/2021   Lab Results  Component Value Date   CHOL 132 09/10/2019   Lab Results  Component Value Date   HDL 51 09/10/2019   Lab Results  Component Value Date   LDLCALC 70 09/10/2019   Lab Results  Component Value Date   TRIG 47 09/10/2019   Lab Results  Component Value Date   CHOLHDL 2.6 09/10/2019   Lab Results  Component Value Date   HGBA1C 5.4 09/10/2019       Assessment & Plan:   Problem List Items Addressed This Visit       Cardiovascular and Mediastinum   Benign essential HTN - Primary   Relevant Medications   amLODipine (NORVASC) 5 MG tablet, refilled without change Encouraged continued diet and exercise efforts  Encouraged continued compliance with medication       Other   Abnormal TSH   Relevant Orders   TSH+T4F+T3Free (Completed)   Other Visit Diagnoses     Other eczema       Relevant Medications   triamcinolone cream (KENALOG) 0.5 %, refilled as requested   Follow up in 6 wks for reevaluation of TSH, sooner as needed    I am having Lennon Alstrom maintain her ibuprofen, naproxen sodium, multivitamin, Vitamin D (Cholecalciferol), Larin 24 FE, cyclobenzaprine, naratriptan,  triamcinolone cream, and amLODipine.  Meds ordered this encounter  Medications   triamcinolone cream (KENALOG) 0.5 %    Sig: APPLY CREAM TOPICALLY TO AFFECTED AREA 4 TIMES DAILY    Dispense:  30 g    Refill:  2   amLODipine (NORVASC) 5 MG tablet    Sig: Take 1 tablet (5 mg total) by mouth daily.    Dispense:  90 tablet    Refill:  3     Teena Dunk, NP

## 2021-11-06 NOTE — Patient Instructions (Signed)
You were seen today in the [facility] for reevaluation of HTN. Labs were collected, results will be available via MyChart or, if abnormal, you will be contacted by clinic staff. You were prescribed medications, please take as directed. Please follow up in 6 wks  for reevaluation TSH.

## 2021-11-07 ENCOUNTER — Other Ambulatory Visit: Payer: Self-pay | Admitting: Nurse Practitioner

## 2021-11-07 DIAGNOSIS — E038 Other specified hypothyroidism: Secondary | ICD-10-CM

## 2021-11-07 LAB — TSH+T4F+T3FREE
Free T4: 0.87 ng/dL (ref 0.82–1.77)
T3, Free: 2.9 pg/mL (ref 2.0–4.4)
TSH: 8.52 u[IU]/mL — ABNORMAL HIGH (ref 0.450–4.500)

## 2021-11-07 MED ORDER — LEVOTHYROXINE SODIUM 75 MCG PO TABS: 75.0000 ug | ORAL_TABLET | Freq: Every day | ORAL | 0 refills | Status: DC

## 2021-12-04 ENCOUNTER — Other Ambulatory Visit: Payer: Self-pay | Admitting: Nurse Practitioner

## 2021-12-07 ENCOUNTER — Other Ambulatory Visit: Payer: Self-pay | Admitting: Family Medicine

## 2021-12-07 MED ORDER — NARATRIPTAN HCL 2.5 MG PO TABS: ORAL_TABLET | ORAL | 0 refills | Status: DC

## 2021-12-07 NOTE — Progress Notes (Signed)
Meds ordered this encounter  Medications   naratriptan (AMERGE) 2.5 MG tablet    Sig: TAKE 1 TABLET BY MOUTH AT ONSET OF HEADACHE MAY REPEAT ONCE IN 2 HOURS AS NEEDED FOR MIGRAINE    Dispense:  9 tablet    Refill:  0    Order Specific Question:   Supervising Provider    AnswerTresa Garter [id number]

## 2021-12-18 ENCOUNTER — Other Ambulatory Visit: Payer: Self-pay | Admitting: Nurse Practitioner

## 2021-12-18 ENCOUNTER — Other Ambulatory Visit: Payer: No Typology Code available for payment source

## 2021-12-18 DIAGNOSIS — E0590 Thyrotoxicosis, unspecified without thyrotoxic crisis or storm: Secondary | ICD-10-CM

## 2021-12-19 LAB — TSH: TSH: 2.81 u[IU]/mL (ref 0.450–4.500)

## 2021-12-20 NOTE — Telephone Encounter (Signed)
Note opened in error.

## 2021-12-21 ENCOUNTER — Other Ambulatory Visit: Payer: Self-pay | Admitting: Nurse Practitioner

## 2021-12-21 DIAGNOSIS — E038 Other specified hypothyroidism: Secondary | ICD-10-CM

## 2022-01-31 ENCOUNTER — Other Ambulatory Visit: Payer: Self-pay | Admitting: Family Medicine

## 2022-01-31 ENCOUNTER — Other Ambulatory Visit (HOSPITAL_COMMUNITY): Payer: Self-pay

## 2022-02-02 ENCOUNTER — Telehealth: Payer: Self-pay

## 2022-02-02 ENCOUNTER — Other Ambulatory Visit: Payer: Self-pay

## 2022-02-02 DIAGNOSIS — K5904 Chronic idiopathic constipation: Secondary | ICD-10-CM | POA: Insufficient documentation

## 2022-02-02 DIAGNOSIS — R140 Abdominal distension (gaseous): Secondary | ICD-10-CM | POA: Insufficient documentation

## 2022-02-02 DIAGNOSIS — E669 Obesity, unspecified: Secondary | ICD-10-CM | POA: Insufficient documentation

## 2022-02-02 DIAGNOSIS — R1032 Left lower quadrant pain: Secondary | ICD-10-CM | POA: Insufficient documentation

## 2022-02-02 DIAGNOSIS — E038 Other specified hypothyroidism: Secondary | ICD-10-CM

## 2022-02-02 NOTE — Telephone Encounter (Signed)
Received fax from [facility] requesting patient to be prescribed [employer] brand Levothyroxine. Per Epic this may not be covered by patient's insurance. We have no documentation regarding patient requesting/requiring brand name only.  LVM to ask patient about [employer] brand

## 2022-03-26 ENCOUNTER — Other Ambulatory Visit: Payer: Self-pay | Admitting: Family Medicine

## 2022-03-28 ENCOUNTER — Other Ambulatory Visit: Payer: Self-pay | Admitting: Family Medicine

## 2022-03-29 ENCOUNTER — Other Ambulatory Visit: Payer: Self-pay | Admitting: Nurse Practitioner

## 2022-03-29 MED ORDER — NARATRIPTAN HCL 2.5 MG PO TABS: ORAL_TABLET | ORAL | 0 refills | Status: DC

## 2022-04-02 NOTE — Telephone Encounter (Signed)
[facility] is requesting to fill pt naratriptan. Please advise [facility]

## 2022-04-02 NOTE — Telephone Encounter (Signed)
Sent pt a  my chart message to advise of the appointment need. Donna Bradshaw

## 2022-06-03 ENCOUNTER — Other Ambulatory Visit: Payer: Self-pay | Admitting: Nurse Practitioner

## 2022-06-07 DIAGNOSIS — R102 Pelvic and perineal pain: Secondary | ICD-10-CM | POA: Diagnosis not present

## 2022-06-07 DIAGNOSIS — N809 Endometriosis, unspecified: Secondary | ICD-10-CM | POA: Diagnosis not present

## 2022-06-11 ENCOUNTER — Other Ambulatory Visit: Payer: Self-pay | Admitting: Nurse Practitioner

## 2022-06-11 MED ORDER — NARATRIPTAN HCL 2.5 MG PO TABS: ORAL_TABLET | ORAL | 0 refills | Status: DC

## 2022-07-19 ENCOUNTER — Other Ambulatory Visit (HOSPITAL_COMMUNITY): Payer: Self-pay

## 2022-07-19 ENCOUNTER — Other Ambulatory Visit: Payer: Self-pay

## 2022-07-19 ENCOUNTER — Ambulatory Visit (INDEPENDENT_AMBULATORY_CARE_PROVIDER_SITE_OTHER): Payer: 59 | Admitting: Nurse Practitioner

## 2022-07-19 ENCOUNTER — Encounter: Payer: Self-pay | Admitting: Nurse Practitioner

## 2022-07-19 DIAGNOSIS — I10 Essential (primary) hypertension: Secondary | ICD-10-CM | POA: Diagnosis not present

## 2022-07-19 DIAGNOSIS — E038 Other specified hypothyroidism: Secondary | ICD-10-CM

## 2022-07-19 MED ORDER — AMLODIPINE BESYLATE 5 MG PO TABS
5.0000 mg | ORAL_TABLET | Freq: Every day | ORAL | 3 refills | Status: DC
  Filled 2022-07-19 – 2022-08-13 (×2): qty 90, 90d supply, fill #0
  Filled 2022-11-15: qty 90, 90d supply, fill #1
  Filled 2022-12-18 – 2023-02-25 (×2): qty 90, 90d supply, fill #2

## 2022-07-19 MED ORDER — NARATRIPTAN HCL 2.5 MG PO TABS
ORAL_TABLET | ORAL | 3 refills | Status: DC
  Filled 2022-07-19: qty 9, 30d supply, fill #0
  Filled 2022-08-13: qty 9, 30d supply, fill #1
  Filled 2022-09-12: qty 9, 30d supply, fill #2
  Filled 2022-10-18: qty 9, 30d supply, fill #3

## 2022-07-19 MED ORDER — CYCLOBENZAPRINE HCL 10 MG PO TABS
10.0000 mg | ORAL_TABLET | Freq: Every day | ORAL | 3 refills | Status: DC
  Filled 2022-07-19: qty 90, 90d supply, fill #0

## 2022-07-19 MED ORDER — LEVOTHYROXINE SODIUM 75 MCG PO TABS
75.0000 ug | ORAL_TABLET | Freq: Every day | ORAL | 1 refills | Status: DC
  Filled 2022-07-19: qty 90, 90d supply, fill #0

## 2022-07-19 MED ORDER — AMLODIPINE BESYLATE 5 MG PO TABS
5.0000 mg | ORAL_TABLET | Freq: Every day | ORAL | 3 refills | Status: DC
  Filled 2022-07-19: qty 90, 90d supply, fill #0

## 2022-07-19 MED ORDER — LEVOTHYROXINE SODIUM 75 MCG PO TABS
75.0000 ug | ORAL_TABLET | Freq: Every day | ORAL | 3 refills | Status: DC
  Filled 2022-07-19: qty 30, 30d supply, fill #0
  Filled 2022-08-13: qty 30, 30d supply, fill #1
  Filled 2022-09-12: qty 30, 30d supply, fill #2
  Filled 2022-10-18: qty 30, 30d supply, fill #3
  Filled 2022-11-15: qty 30, 30d supply, fill #4
  Filled 2022-12-18: qty 30, 30d supply, fill #5
  Filled 2023-01-15: qty 30, 30d supply, fill #6
  Filled 2023-02-25: qty 30, 30d supply, fill #7

## 2022-07-19 NOTE — Assessment & Plan Note (Signed)
-   amLODipine (NORVASC) 5 MG tablet; Take 1 tablet (5 mg total) by mouth daily.  Dispense: 90 tablet; Refill: 3 - CBC - Comprehensive metabolic panel  2. Other specified hypothyroidism  - levothyroxine (SYNTHROID) 75 MCG tablet; Take 1 tablet (75 mcg total) by mouth daily.  Dispense: 90 tablet; Refill: 1 - Thyroid Panel With TSH  Follow up:  Follow up in 1 year

## 2022-07-19 NOTE — Patient Instructions (Addendum)
1. Benign essential HTN  - amLODipine (NORVASC) 5 MG tablet; Take 1 tablet (5 mg total) by mouth daily.  Dispense: 90 tablet; Refill: 3 - CBC - Comprehensive metabolic panel  2. Other specified hypothyroidism  - levothyroxine (SYNTHROID) 75 MCG tablet; Take 1 tablet (75 mcg total) by mouth daily.  Dispense: 90 tablet; Refill: 1 - Thyroid Panel With TSH  Follow up:  Follow up in 1 year

## 2022-07-19 NOTE — Progress Notes (Signed)
$@Patientb$  ID: Donna Bradshaw, female    DOB: 1985-08-02, 37 y.o.   MRN: [id number]  Chief Complaint  Patient presents with   Follow-up    Referring provider: Fenton Foy, NP   HPI  Donna Bradshaw 37 y.o. female  has a past medical history of Asthma, Hypertension, Hyperthyroidism, Migraine, and Uterine fibroid. To the [facility] for reevaluation of HTN.    Hypertension: Patient here for follow-up of elevated blood pressure. She is exercising and is adherent to low salt diet.  Denies checking B/P at home. Cardiac symptoms none. Patient denies none.  Cardiovascular risk factors: hypertension and obesity (BMI >= 30 kg/m2). Use of agents associated with hypertension: none. History of target organ damage: none. Denies any other concerns today. Questions whether she needed to have her B/P medication adjusted.   Hypothyroidism: continues on synthroid. Will recheck thyroid panel today. No new issues or concerns.  Denies f/c/s, n/v/d, hemoptysis, PND, leg swelling Denies chest pain or edema     No Known Allergies  Immunization History  Administered Date(s) Administered   Influenza, Quadrivalent, Recombinant, Inj, Pf 03/02/2018   Influenza,inj,Quad PF,6+ Mos 02/17/2017, 01/29/2019   Influenza,inj,quad, With Preservative 02/26/2015   Influenza-Unspecified 02/26/2015, 02/17/2017, 02/25/2018, 02/23/2020   PFIZER(Purple Top)SARS-COV-2 Vaccination 07/17/2019, 08/07/2019   Td 11/05/2017   Tdap 12/31/2017    Past Medical History:  Diagnosis Date   Asthma    Hypertension    Hyperthyroidism    no longer an issue   Migraine    Uterine fibroid     Tobacco History: Social History   Tobacco Use  Smoking Status Never  Smokeless Tobacco Never   Counseling given: Not Answered   Outpatient Encounter Medications as of 07/19/2022  Medication Sig   ibuprofen (ADVIL,MOTRIN) 200 MG tablet Take 600-800 mg by mouth as needed (for cramps).   Multiple Vitamin (MULTIVITAMIN) capsule Take by  mouth.   naproxen sodium (ALEVE) 220 MG tablet Take 440 mg by mouth as needed.   triamcinolone cream (KENALOG) 0.5 % APPLY CREAM TOPICALLY TO AFFECTED AREA 4 TIMES DAILY   Vitamin D, Cholecalciferol, 25 MCG (1000 UT) TABS Take by mouth. 2,000 units daily   [DISCONTINUED] amLODipine (NORVASC) 5 MG tablet Take 1 tablet (5 mg total) by mouth daily.   [DISCONTINUED] cyclobenzaprine (FLEXERIL) 10 MG tablet Take 1 tablet (10 mg total) by mouth at bedtime. And headaches.   [DISCONTINUED] levothyroxine (SYNTHROID) 75 MCG tablet Take 1 tablet (75 mcg total) by mouth daily.   [DISCONTINUED] naratriptan (AMERGE) 2.5 MG tablet Take one (1) tablet at onset of headache; if returns or does not resolve, may repeat after 4 hours; do not exceed five (5) mg in 24 hours.   amLODipine (NORVASC) 5 MG tablet Take 1 tablet (5 mg total) by mouth daily.   cyclobenzaprine (FLEXERIL) 10 MG tablet Take 1 tablet (10 mg total) by mouth at bedtime. And headaches.   LARIN 24 FE 1-20 MG-MCG(24) tablet Take 1 tablet by mouth daily. (Patient not taking: Reported on 07/19/2022)   levothyroxine (SYNTHROID) 75 MCG tablet Take 1 tablet (75 mcg total) by mouth daily.   naratriptan (AMERGE) 2.5 MG tablet Take one (1) tablet at onset of headache; if returns or does not resolve, may repeat after 4 hours; do not exceed five (5) mg in 24 hours.   [DISCONTINUED] amLODipine (NORVASC) 5 MG tablet Take 1 tablet (5 mg total) by mouth daily.   [DISCONTINUED] levothyroxine (SYNTHROID) 75 MCG tablet Take 1 tablet (75 mcg total) by mouth daily.  No facility-administered encounter medications on file as of 07/19/2022.     Review of Systems  Review of Systems  Constitutional: Negative.   HENT: Negative.    Cardiovascular: Negative.   Gastrointestinal: Negative.   Allergic/Immunologic: Negative.   Neurological: Negative.   Psychiatric/Behavioral: Negative.         Physical Exam  BP 122/88   Pulse 87   Temp 97.8 F (36.6 C)   Ht 5' 4"$   (1.626 m)   Wt 203 lb (92.1 kg)   LMP 07/18/2022 Comment: started yesterday.  SpO2 100%   BMI 34.84 kg/m   Wt Readings from Last 5 Encounters:  07/19/22 203 lb (92.1 kg)  11/06/21 195 lb 6.4 oz (88.6 kg)  08/04/21 201 lb 9.6 oz (91.4 kg)  04/18/21 200 lb (90.7 kg)  04/03/21 202 lb 9.6 oz (91.9 kg)     Physical Exam Vitals and nursing note reviewed.  Constitutional:      General: She is not in acute distress.    Appearance: She is well-developed.  Cardiovascular:     Rate and Rhythm: Normal rate and regular rhythm.  Pulmonary:     Effort: Pulmonary effort is normal.     Breath sounds: Normal breath sounds.  Neurological:     Mental Status: She is alert and oriented to person, place, and time.      Lab Results:  CBC    Component Value Date/Time   WBC 4.9 04/14/2020 1010   WBC 12.2 (H) 03/06/2017 1214   RBC 4.12 04/14/2020 1010   RBC 2.50 (L) 03/06/2017 1214   HGB 11.3 04/14/2020 1010   HCT 34.1 04/14/2020 1010   PLT 364 04/14/2020 1010   MCV 83 04/14/2020 1010   MCH 27.4 04/14/2020 1010   MCH 30.4 03/06/2017 1214   MCHC 33.1 04/14/2020 1010   MCHC 34.2 03/06/2017 1214   RDW 13.2 04/14/2020 1010   LYMPHSABS 2.1 04/14/2020 1010   MONOABS 1.2 (H) 02/24/2007 0105   EOSABS 0.2 04/14/2020 1010   BASOSABS 0.0 04/14/2020 1010    BMET    Component Value Date/Time   NA 137 04/03/2021 1611   K 4.1 04/03/2021 1611   CL 102 04/03/2021 1611   CO2 24 02/25/2017 1025   GLUCOSE 81 04/03/2021 1611   GLUCOSE 96 02/25/2017 1025   BUN 10 04/03/2021 1611   CREATININE 1.11 (H) 04/03/2021 1611   CALCIUM 9.4 04/03/2021 1611   GFRNONAA 63 04/14/2020 1010   GFRAA 73 04/14/2020 1010     Assessment & Plan:   No problem-specific Assessment & Plan notes found for this encounter.     Fenton Foy, NP 07/19/2022

## 2022-07-20 ENCOUNTER — Other Ambulatory Visit: Payer: Self-pay

## 2022-07-20 LAB — COMPREHENSIVE METABOLIC PANEL
ALT: 22 IU/L (ref 0–32)
AST: 22 IU/L (ref 0–40)
Albumin/Globulin Ratio: 1.5 (ref 1.2–2.2)
Albumin: 4.1 g/dL (ref 3.9–4.9)
Alkaline Phosphatase: 53 IU/L (ref 44–121)
BUN/Creatinine Ratio: 9 (ref 9–23)
BUN: 11 mg/dL (ref 6–20)
Bilirubin Total: 0.3 mg/dL (ref 0.0–1.2)
CO2: 23 mmol/L (ref 20–29)
Calcium: 9.3 mg/dL (ref 8.7–10.2)
Chloride: 100 mmol/L (ref 96–106)
Creatinine, Ser: 1.19 mg/dL — ABNORMAL HIGH (ref 0.57–1.00)
Globulin, Total: 2.7 g/dL (ref 1.5–4.5)
Glucose: 117 mg/dL — ABNORMAL HIGH (ref 70–99)
Potassium: 3.7 mmol/L (ref 3.5–5.2)
Sodium: 136 mmol/L (ref 134–144)
Total Protein: 6.8 g/dL (ref 6.0–8.5)
eGFR: 60 mL/min/{1.73_m2} (ref >59)

## 2022-07-20 LAB — CBC
Hematocrit: 34.8 % (ref 34.0–46.6)
Hemoglobin: 12.1 g/dL (ref 11.1–15.9)
MCH: 30.1 pg (ref 26.6–33.0)
MCHC: 34.8 g/dL (ref 31.5–35.7)
MCV: 87 fL (ref 79–97)
Platelets: 294 10*3/uL (ref 150–450)
RBC: 4.02 x10E6/uL (ref 3.77–5.28)
RDW: 13.1 % (ref 11.7–15.4)
WBC: 6.3 10*3/uL (ref 3.4–10.8)

## 2022-07-20 LAB — THYROID PANEL WITH TSH
Free Thyroxine Index: 2.2 (ref 1.2–4.9)
T3 Uptake Ratio: 29 % (ref 24–39)
T4, Total: 7.5 ug/dL (ref 4.5–12.0)
TSH: 2.37 u[IU]/mL (ref 0.450–4.500)

## 2022-07-20 NOTE — Progress Notes (Signed)
Lab results mail it to pt home address.

## 2022-07-21 ENCOUNTER — Other Ambulatory Visit (HOSPITAL_COMMUNITY): Payer: Self-pay

## 2022-07-27 ENCOUNTER — Other Ambulatory Visit (HOSPITAL_COMMUNITY): Payer: Self-pay

## 2022-08-13 ENCOUNTER — Other Ambulatory Visit (HOSPITAL_COMMUNITY): Payer: Self-pay

## 2022-08-13 ENCOUNTER — Other Ambulatory Visit: Payer: Self-pay

## 2022-09-15 ENCOUNTER — Other Ambulatory Visit (HOSPITAL_COMMUNITY): Payer: Self-pay

## 2022-10-19 ENCOUNTER — Other Ambulatory Visit (HOSPITAL_COMMUNITY): Payer: Self-pay

## 2022-11-15 ENCOUNTER — Other Ambulatory Visit: Payer: Self-pay | Admitting: Nurse Practitioner

## 2022-11-16 ENCOUNTER — Other Ambulatory Visit (HOSPITAL_COMMUNITY): Payer: Self-pay

## 2022-11-16 ENCOUNTER — Other Ambulatory Visit: Payer: Self-pay

## 2022-11-16 MED ORDER — NARATRIPTAN HCL 2.5 MG PO TABS
ORAL_TABLET | ORAL | 3 refills | Status: DC
  Filled 2022-11-16: qty 9, 5d supply, fill #0
  Filled 2022-12-18: qty 9, 30d supply, fill #0
  Filled 2023-01-15: qty 9, 30d supply, fill #1
  Filled 2023-02-25: qty 9, 30d supply, fill #2

## 2022-12-18 ENCOUNTER — Other Ambulatory Visit (HOSPITAL_COMMUNITY): Payer: Self-pay

## 2022-12-18 ENCOUNTER — Other Ambulatory Visit: Payer: Self-pay

## 2022-12-20 ENCOUNTER — Other Ambulatory Visit: Payer: Self-pay | Admitting: Nurse Practitioner

## 2022-12-20 MED ORDER — AZITHROMYCIN 250 MG PO TABS: ORAL_TABLET | ORAL | 0 refills | Status: AC

## 2023-01-17 ENCOUNTER — Other Ambulatory Visit (HOSPITAL_COMMUNITY): Payer: Self-pay

## 2023-02-06 ENCOUNTER — Ambulatory Visit
Admission: EM | Admit: 2023-02-06 | Discharge: 2023-02-06 | Disposition: A | Discharge status: Home / Self Care | Payer: 59 | Attending: Internal Medicine | Admitting: Internal Medicine

## 2023-02-06 DIAGNOSIS — Z113 Encounter for screening for infections with a predominantly sexual mode of transmission: Secondary | ICD-10-CM | POA: Insufficient documentation

## 2023-02-06 DIAGNOSIS — N898 Other specified noninflammatory disorders of vagina: Secondary | ICD-10-CM | POA: Insufficient documentation

## 2023-02-06 MED ORDER — FLUCONAZOLE 150 MG PO TABS
150.0000 mg | ORAL_TABLET | Freq: Every day | ORAL | 0 refills | Status: AC
Start: 2023-02-06 — End: 2023-02-08

## 2023-02-06 NOTE — ED Triage Notes (Signed)
Pt presents to UC w/ c/o vaginal irritation, white discharge x2 days.

## 2023-02-06 NOTE — Discharge Instructions (Addendum)
The clinic will contact you with results of the testing done today if positive.  Follow-up with your PCP if your symptoms are not improving.  Hope you feel better soon!

## 2023-02-06 NOTE — ED Provider Notes (Addendum)
[facility]    CSN: [id number] Arrival date & time: 02/06/23  1655      History   Chief Complaint Chief Complaint  Patient presents with   Vaginal Discharge    HPI Donna Bradshaw is a 37 y.o. female presents for vaginal discharge.  Patient reports 2 days of a mildly irritating vaginal discharge that is not pruritic or malodorous.  Denies any vaginal rashes or lesions.  No dysuria, fevers, nausea/vomiting, flank pain.  No known STD exposure but she would like screening.  No other concerns at this time.  Vaginal Discharge   Past Medical History:  Diagnosis Date   Asthma    Hypertension    Hyperthyroidism    no longer an issue   Migraine    Uterine fibroid     Patient Active Problem List   Diagnosis Date Noted   Abdominal bloating 02/02/2022   Chronic idiopathic constipation 02/02/2022   Left lower quadrant pain 02/02/2022   Obesity 02/02/2022   Abnormal TSH 09/14/2019   Vitamin D insufficiency 09/14/2019   Migraine without aura and without status migrainosus, not intractable 05/08/2017   Benign essential HTN 05/08/2017   S/P myomectomy 03/05/2017   Uterine leiomyoma 12/19/2015   Migraine 12/19/2015   Migraine without aura 06/14/2015   Hyperthyroidism 07/21/2014    Past Surgical History:  Procedure Laterality Date   MYOMECTOMY N/A 03/05/2017   Procedure: MYOMECTOMY, ABDOMINAL;  Surgeon: Mitchel Honour, DO;  Location: [facility];  Service: Gynecology;  Laterality: N/A;   WISDOM TOOTH EXTRACTION      OB History     Gravida  0   Para  0   Term  0   Preterm  0   AB  0   Living  0      SAB  0   IAB  0   Ectopic  0   Multiple  0   Live Births  0            Home Medications    Prior to Admission medications   Medication Sig Start Date End Date Taking? Authorizing Provider  fluconazole (DIFLUCAN) 150 MG tablet Take 1 tablet (150 mg total) by mouth daily for 2 doses. Take 1 tablet today and may repeat in 3 days if symptoms persist  02/06/23 02/08/23 Yes Radford Pax, NP  amLODipine (NORVASC) 5 MG tablet Take 1 tablet (5 mg total) by mouth daily. 07/19/22 07/19/23  Ivonne Andrew, NP  cyclobenzaprine (FLEXERIL) 10 MG tablet Take 1 tablet (10 mg total) by mouth at bedtime. And headaches. 07/19/22   Ivonne Andrew, NP  ibuprofen (ADVIL,MOTRIN) 200 MG tablet Take 600-800 mg by mouth as needed (for cramps).    [provider]  LARIN 24 FE 1-20 MG-MCG(24) tablet Take 1 tablet by mouth daily. Patient not taking: Reported on 07/19/2022 01/31/22   [provider]  levothyroxine (SYNTHROID) 75 MCG tablet Take 1 tablet (75 mcg total) by mouth daily. 07/19/22   Ivonne Andrew, NP  Multiple Vitamin (MULTIVITAMIN) capsule Take by mouth.    [provider]  naproxen sodium (ALEVE) 220 MG tablet Take 440 mg by mouth as needed.    [provider]  naratriptan (AMERGE) 2.5 MG tablet Take 1 tablet by mouth at onset of headache; if returns or does not resolve, may repeat after 4 hours; do not exceed 2 tablets in 24 hours. 11/16/22   Ivonne Andrew, NP  triamcinolone cream (KENALOG) 0.5 % APPLY CREAM TOPICALLY  TO AFFECTED AREA 4 TIMES DAILY 11/06/21   Orion Crook I, NP  Vitamin D, Cholecalciferol, 25 MCG (1000 UT) TABS Take by mouth. 2,000 units daily    [provider]    Family History Family History  Problem Relation Age of Onset   Diabetes Maternal Uncle    Diabetes Maternal Grandmother    Breast cancer Paternal Grandmother    Cancer Other    Seizures Other    Migraines Neg Hx    Colon cancer Neg Hx    Stomach cancer Neg Hx    Pancreatic cancer Neg Hx    Esophageal cancer Neg Hx    Rectal cancer Neg Hx     Social History Social History   Tobacco Use   Smoking status: Never   Smokeless tobacco: Never  Vaping Use   Vaping status: Never Used  Substance Use Topics   Alcohol use: Yes    Alcohol/week: 0.0 standard drinks of alcohol    Comment: occ   Drug use: No      Allergies   Patient has no known allergies.   Review of Systems Review of Systems  Genitourinary:  Positive for vaginal discharge.     Physical Exam Triage Vital Signs ED Triage Vitals  Encounter Vitals Group     BP 02/06/23 1911 (!) 144/92     Systolic BP Percentile --      Diastolic BP Percentile --      Pulse Rate 02/06/23 1911 71     Resp 02/06/23 1911 16     Temp 02/06/23 1911 98.4 F (36.9 C)     Temp Source 02/06/23 1911 Oral     SpO2 02/06/23 1911 98 %     Weight --      Height --      Head Circumference --      Peak Flow --      Pain Score 02/06/23 1916 0     Pain Loc --      Pain Education --      Exclude from Growth Chart --    No data found.  Updated Vital Signs BP (!) 144/92 (BP Location: Right Arm)   Pulse 71   Temp 98.4 F (36.9 C) (Oral)   Resp 16   LMP 01/17/2023 (Approximate)   SpO2 98%   Visual Acuity Right Eye Distance:   Left Eye Distance:   Bilateral Distance:    Right Eye Near:   Left Eye Near:    Bilateral Near:     Physical Exam Vitals and nursing note reviewed.  Constitutional:      Appearance: Normal appearance.  HENT:     Head: Normocephalic and atraumatic.  Eyes:     Pupils: Pupils are equal, round, and reactive to light.  Cardiovascular:     Rate and Rhythm: Normal rate.  Pulmonary:     Effort: Pulmonary effort is normal.  Abdominal:     Tenderness: There is no right CVA tenderness or left CVA tenderness.  Skin:    General: Skin is warm and dry.  Neurological:     General: No focal deficit present.     Mental Status: She is alert and oriented to person, place, and time.  Psychiatric:        Mood and Affect: Mood normal.        Behavior: Behavior normal.      UC Treatments / Results  Labs (all labs ordered are listed, but only abnormal results are displayed) Labs  Reviewed  RPR  HIV ANTIBODY (ROUTINE TESTING W REFLEX)  CERVICOVAGINAL ANCILLARY ONLY    EKG   Radiology No results  found.  Procedures Procedures (including critical care time)  Medications Ordered in UC Medications - No data to display  Initial Impression / Assessment and Plan / UC Course  I have reviewed the triage vital signs and the nursing notes.  Pertinent labs & imaging results that were available during my care of the patient were reviewed by me and considered in my medical decision making (see chart for details).     Reviewed exam and symptoms with patient.  No red flags.  Vaginal swab/blood work is ordered and will contact for any positive results.  Patient would like treatment for yeast while awaiting results.  Diflucan sent to pharmacy.  PCP follow-up if symptoms do not improve.  ER precautions reviewed and patient verbalized understanding. Final Clinical Impressions(s) / UC Diagnoses   Final diagnoses:  Vaginal discharge  Screening examination for STD (sexually transmitted disease)     Discharge Instructions      The clinic will contact you with results of the testing done today if positive.  Follow-up with your PCP if your symptoms are not improving.  Hope you feel better soon!    ED Prescriptions     Medication Sig Dispense Auth. Provider   fluconazole (DIFLUCAN) 150 MG tablet Take 1 tablet (150 mg total) by mouth daily for 2 doses. Take 1 tablet today and may repeat in 3 days if symptoms persist 2 tablet Radford Pax, NP      PDMP not reviewed this encounter.   Radford Pax, NP 02/06/23 1928    Radford Pax, NP 02/06/23 1943

## 2023-02-07 LAB — CERVICOVAGINAL ANCILLARY ONLY
Bacterial Vaginitis (gardnerella): POSITIVE — AB
Candida Glabrata: NEGATIVE
Candida Vaginitis: POSITIVE — AB
Chlamydia: NEGATIVE
Comment: NEGATIVE
Comment: NEGATIVE
Comment: NEGATIVE
Comment: NEGATIVE
Comment: NEGATIVE
Comment: NORMAL
Neisseria Gonorrhea: NEGATIVE
Trichomonas: NEGATIVE

## 2023-02-08 ENCOUNTER — Telehealth: Payer: Self-pay

## 2023-02-08 LAB — HIV ANTIBODY (ROUTINE TESTING W REFLEX): HIV Screen 4th Generation wRfx: NONREACTIVE

## 2023-02-08 LAB — RPR: RPR Ser Ql: NONREACTIVE

## 2023-02-08 MED ORDER — METRONIDAZOLE 500 MG PO TABS: 500.0000 mg | ORAL_TABLET | Freq: Two times a day (BID) | ORAL | 0 refills | Status: AC

## 2023-02-08 NOTE — Telephone Encounter (Signed)
Per protocol, pt requires tx with metronidazole. Attempted to reach patient x1. LVM Rx sent to pharmacy on file.

## 2023-02-25 ENCOUNTER — Other Ambulatory Visit (HOSPITAL_COMMUNITY): Payer: Self-pay

## 2023-03-13 ENCOUNTER — Ambulatory Visit (INDEPENDENT_AMBULATORY_CARE_PROVIDER_SITE_OTHER): Payer: 59 | Admitting: Nurse Practitioner

## 2023-03-13 ENCOUNTER — Encounter: Payer: Self-pay | Admitting: Nurse Practitioner

## 2023-03-13 VITALS — BP 122/88 | HR 82 | Resp 12 | Ht 64.0 in | Wt 198.0 lb

## 2023-03-13 DIAGNOSIS — E038 Other specified hypothyroidism: Secondary | ICD-10-CM

## 2023-03-13 DIAGNOSIS — Z1329 Encounter for screening for other suspected endocrine disorder: Secondary | ICD-10-CM

## 2023-03-13 DIAGNOSIS — R42 Dizziness and giddiness: Secondary | ICD-10-CM

## 2023-03-13 DIAGNOSIS — L308 Other specified dermatitis: Secondary | ICD-10-CM

## 2023-03-13 DIAGNOSIS — I10 Essential (primary) hypertension: Secondary | ICD-10-CM | POA: Diagnosis not present

## 2023-03-13 DIAGNOSIS — L309 Dermatitis, unspecified: Secondary | ICD-10-CM | POA: Diagnosis not present

## 2023-03-13 DIAGNOSIS — N898 Other specified noninflammatory disorders of vagina: Secondary | ICD-10-CM | POA: Diagnosis not present

## 2023-03-13 MED ORDER — TRIAMCINOLONE ACETONIDE 0.5 % EX CREA: TOPICAL_CREAM | CUTANEOUS | 2 refills | Status: AC

## 2023-03-13 MED ORDER — DOXYCYCLINE HYCLATE 100 MG PO TABS
100.0000 mg | ORAL_TABLET | Freq: Two times a day (BID) | ORAL | 0 refills | Status: AC
Start: 2023-03-13 — End: 2023-03-20

## 2023-03-13 MED ORDER — NARATRIPTAN HCL 2.5 MG PO TABS: ORAL_TABLET | ORAL | 3 refills | Status: DC

## 2023-03-13 MED ORDER — AMLODIPINE BESYLATE 5 MG PO TABS
5.0000 mg | ORAL_TABLET | Freq: Every day | ORAL | 3 refills | Status: DC
Start: 2023-03-13 — End: 2024-04-10

## 2023-03-13 MED ORDER — MECLIZINE HCL 12.5 MG PO TABS: 12.5000 mg | ORAL_TABLET | Freq: Three times a day (TID) | ORAL | 0 refills | Status: DC | PRN

## 2023-03-13 MED ORDER — LEVOTHYROXINE SODIUM 75 MCG PO TABS
75.0000 ug | ORAL_TABLET | Freq: Every day | ORAL | 3 refills | Status: DC
Start: 2023-03-13 — End: 2024-04-10

## 2023-03-13 NOTE — Progress Notes (Signed)
Subjective   Patient ID: Donna Bradshaw, female    DOB: May 05, 1986, 37 y.o.   MRN: [id number]  Chief Complaint  Patient presents with   Follow-up    Referring provider: Ivonne Andrew, NP  Donna Bradshaw is a 37 y.o. female with Past Medical History: No date: Asthma No date: Hypertension No date: Hyperthyroidism     Comment:  no longer an issue No date: Migraine No date: Uterine fibroid   HPI  Hypertension:   Patient here for follow-up of elevated blood pressure. She is exercising and is adherent to low salt diet.  Denies checking B/P at home. Cardiac symptoms none. Patient denies none. Cardiovascular risk factors: hypertension and obesity (BMI >= 30 kg/m2). Use of agents associated with hypertension: none. History of target organ damage: none. Denies any other concerns today. Questions whether she needed to have her B/P medication adjusted.    Hypothyroidism:   Continues on synthroid. Will recheck thyroid panel today. No new issues or concerns.  Patient does also complain today of vaginal irritation.  She is currently having her period.  We discussed that she can return after her menstrual cycle to have a vaginal swab completed.  Patient states that she does have recurrent eczema to her genital area she has had doxycycline and triamcinolone cream for this in the past.  We will reorder this for her today.   Denies f/c/s, n/v/d, hemoptysis, PND, leg swelling Denies chest pain or edema    No Known Allergies  Immunization History  Administered Date(s) Administered   Influenza, Quadrivalent, Recombinant, Inj, Pf 03/02/2018   Influenza,inj,Quad PF,6+ Mos 02/17/2017, 01/29/2019   Influenza,inj,quad, With Preservative 02/26/2015   Influenza-Unspecified 02/26/2015, 02/17/2017, 02/25/2018, 02/23/2020   PFIZER(Purple Top)SARS-COV-2 Vaccination 07/17/2019, 08/07/2019   Td 11/05/2017   Tdap 12/31/2017    Tobacco History: Social History   Tobacco Use  Smoking Status Never   Smokeless Tobacco Never   Counseling given: Not Answered   Outpatient Encounter Medications as of 03/13/2023  Medication Sig   cyclobenzaprine (FLEXERIL) 10 MG tablet Take 1 tablet (10 mg total) by mouth at bedtime. And headaches.   doxycycline (VIBRA-TABS) 100 MG tablet Take 1 tablet (100 mg total) by mouth 2 (two) times daily for 7 days.   ibuprofen (ADVIL,MOTRIN) 200 MG tablet Take 600-800 mg by mouth as needed (for cramps).   meclizine (ANTIVERT) 12.5 MG tablet Take 1 tablet (12.5 mg total) by mouth 3 (three) times daily as needed for dizziness.   Multiple Vitamin (MULTIVITAMIN) capsule Take by mouth.   naproxen sodium (ALEVE) 220 MG tablet Take 440 mg by mouth as needed.   Vitamin D, Cholecalciferol, 25 MCG (1000 UT) TABS Take by mouth. 2,000 units daily   [DISCONTINUED] amLODipine (NORVASC) 5 MG tablet Take 1 tablet (5 mg total) by mouth daily.   [DISCONTINUED] levothyroxine (SYNTHROID) 75 MCG tablet Take 1 tablet (75 mcg total) by mouth daily.   [DISCONTINUED] naratriptan (AMERGE) 2.5 MG tablet Take 1 tablet by mouth at onset of headache; if returns or does not resolve, may repeat after 4 hours; do not exceed 2 tablets in 24 hours.   [DISCONTINUED] triamcinolone cream (KENALOG) 0.5 % APPLY CREAM TOPICALLY TO AFFECTED AREA 4 TIMES DAILY   amLODipine (NORVASC) 5 MG tablet Take 1 tablet (5 mg total) by mouth daily.   LARIN 24 FE 1-20 MG-MCG(24) tablet Take 1 tablet by mouth daily. (Patient not taking: Reported on 07/19/2022)   levothyroxine (SYNTHROID) 75 MCG tablet Take 1 tablet (75 mcg total)  by mouth daily.   naratriptan (AMERGE) 2.5 MG tablet Take 1 tablet by mouth at onset of headache; if returns or does not resolve, may repeat after 4 hours; do not exceed 2 tablets in 24 hours.   triamcinolone cream (KENALOG) 0.5 % APPLY CREAM TOPICALLY TO AFFECTED AREA 4 TIMES DAILY   No facility-administered encounter medications on file as of 03/13/2023.    Review of Systems  Review of  Systems  Constitutional: Negative.   HENT: Negative.    Cardiovascular: Negative.   Gastrointestinal: Negative.   Genitourinary:  Positive for vaginal discharge.  Allergic/Immunologic: Negative.   Neurological:  Positive for dizziness.  Psychiatric/Behavioral: Negative.       Objective:   BP 122/88 (BP Location: Left Arm, Patient Position: Sitting, Cuff Size: Normal)   Pulse 82   Resp 12   Ht 5\' 4"  (1.626 m)   Wt 198 lb (89.8 kg)   LMP 03/13/2023   SpO2 99%   BMI 33.99 kg/m   Wt Readings from Last 5 Encounters:  03/13/23 198 lb (89.8 kg)  07/19/22 203 lb (92.1 kg)  11/06/21 195 lb 6.4 oz (88.6 kg)  08/04/21 201 lb 9.6 oz (91.4 kg)  04/18/21 200 lb (90.7 kg)     Physical Exam Vitals and nursing note reviewed.  Constitutional:      General: She is not in acute distress.    Appearance: She is well-developed.  Cardiovascular:     Rate and Rhythm: Normal rate and regular rhythm.  Pulmonary:     Effort: Pulmonary effort is normal.     Breath sounds: Normal breath sounds.  Neurological:     Mental Status: She is alert and oriented to person, place, and time.       Assessment & Plan:   Thyroid disorder screen -     Thyroid Panel With TSH  Benign essential HTN -     CBC -     Comprehensive metabolic panel -     amLODIPine Besylate; Take 1 tablet (5 mg total) by mouth daily.  Dispense: 90 tablet; Refill: 3  Vaginal irritation  Other eczema -     Triamcinolone Acetonide; APPLY CREAM TOPICALLY TO AFFECTED AREA 4 TIMES DAILY  Dispense: 30 g; Refill: 2 -     Doxycycline Hyclate; Take 1 tablet (100 mg total) by mouth 2 (two) times daily for 7 days.  Dispense: 14 tablet; Refill: 0  Eczema, unspecified type  Vertigo -     Meclizine HCl; Take 1 tablet (12.5 mg total) by mouth 3 (three) times daily as needed for dizziness.  Dispense: 30 tablet; Refill: 0  Other specified hypothyroidism -     Levothyroxine Sodium; Take 1 tablet (75 mcg total) by mouth daily.   Dispense: 90 tablet; Refill: 3  Other orders -     Naratriptan HCl; Take 1 tablet by mouth at onset of headache; if returns or does not resolve, may repeat after 4 hours; do not exceed 2 tablets in 24 hours.  Dispense: 9 tablet; Refill: 3     Return in about 6 months (around 09/11/2023).    Ivonne Andrew, NP 03/13/2023

## 2023-03-13 NOTE — Progress Notes (Signed)
Pt is here for follow up  Complaining of dizziness started X2 weeks ago Comes and goes, no history of same   Complaining of vaginal irritation, discharge and itching started X3 days

## 2023-03-13 NOTE — Patient Instructions (Signed)
1. Thyroid disorder screen  - Thyroid Panel With TSH  2. Benign essential HTN  - CBC - Comprehensive metabolic panel  3. Vaginal irritation   4. Other eczema  - triamcinolone cream (KENALOG) 0.5 %; APPLY CREAM TOPICALLY TO AFFECTED AREA 4 TIMES DAILY  Dispense: 30 g; Refill: 2 - doxycycline (VIBRA-TABS) 100 MG tablet; Take 1 tablet (100 mg total) by mouth 2 (two) times daily for 7 days.  Dispense: 14 tablet; Refill: 0  5. Eczema, unspecified type   6. Vertigo  - meclizine (ANTIVERT) 12.5 MG tablet; Take 1 tablet (12.5 mg total) by mouth 3 (three) times daily as needed for dizziness.  Dispense: 30 tablet; Refill: 0   Follow up:  Follow up in 3 months

## 2023-03-14 LAB — COMPREHENSIVE METABOLIC PANEL
ALT: 18 [IU]/L (ref 0–32)
AST: 19 [IU]/L (ref 0–40)
Albumin: 4.2 g/dL (ref 3.9–4.9)
Alkaline Phosphatase: 49 [IU]/L (ref 44–121)
BUN/Creatinine Ratio: 7 — ABNORMAL LOW (ref 9–23)
BUN: 8 mg/dL (ref 6–20)
Bilirubin Total: 0.7 mg/dL (ref 0.0–1.2)
CO2: 24 mmol/L (ref 20–29)
Calcium: 9.4 mg/dL (ref 8.7–10.2)
Chloride: 102 mmol/L (ref 96–106)
Creatinine, Ser: 1.09 mg/dL — ABNORMAL HIGH (ref 0.57–1.00)
Globulin, Total: 2.7 g/dL (ref 1.5–4.5)
Glucose: 111 mg/dL — ABNORMAL HIGH (ref 70–99)
Potassium: 4 mmol/L (ref 3.5–5.2)
Sodium: 137 mmol/L (ref 134–144)
Total Protein: 6.9 g/dL (ref 6.0–8.5)
eGFR: 66 mL/min/{1.73_m2} (ref >59)

## 2023-03-14 LAB — THYROID PANEL WITH TSH
Free Thyroxine Index: 1.9 (ref 1.2–4.9)
T3 Uptake Ratio: 25 % (ref 24–39)
T4, Total: 7.5 ug/dL (ref 4.5–12.0)
TSH: 2.03 u[IU]/mL (ref 0.450–4.500)

## 2023-03-14 LAB — CBC
Hematocrit: 35.4 % (ref 34.0–46.6)
Hemoglobin: 12 g/dL (ref 11.1–15.9)
MCH: 29.2 pg (ref 26.6–33.0)
MCHC: 33.9 g/dL (ref 31.5–35.7)
MCV: 86 fL (ref 79–97)
Platelets: 334 10*3/uL (ref 150–450)
RBC: 4.11 x10E6/uL (ref 3.77–5.28)
RDW: 12.6 % (ref 11.7–15.4)
WBC: 9 10*3/uL (ref 3.4–10.8)

## 2023-06-19 ENCOUNTER — Telehealth: Payer: 59 | Admitting: Nurse Practitioner

## 2023-06-19 DIAGNOSIS — J0140 Acute pansinusitis, unspecified: Secondary | ICD-10-CM | POA: Diagnosis not present

## 2023-06-19 MED ORDER — FLUTICASONE PROPIONATE 50 MCG/ACT NA SUSP
2.0000 | Freq: Every day | NASAL | 6 refills | Status: DC
Start: 2023-06-19 — End: 2023-12-31

## 2023-06-19 MED ORDER — AMOXICILLIN-POT CLAVULANATE 875-125 MG PO TABS
1.0000 | ORAL_TABLET | Freq: Two times a day (BID) | ORAL | 0 refills | Status: AC
Start: 2023-06-19 — End: 2023-06-26

## 2023-06-19 NOTE — Progress Notes (Signed)
Virtual Visit Consent   [name], you are scheduled for a virtual visit with a [facility] provider today. Just as with appointments in the office, your consent must be obtained to participate. Your consent will be active for this visit and any virtual visit you may have with one of our providers in the next 365 days. If you have a MyChart account, a copy of this consent can be sent to you electronically.  As this is a virtual visit, video technology does not allow for your provider to perform a traditional examination. This may limit your provider's ability to fully assess your condition. If your provider identifies any concerns that need to be evaluated in person or the need to arrange testing (such as labs, EKG, etc.), we will make arrangements to do so. Although advances in technology are sophisticated, we cannot ensure that it will always work on either your end or our end. If the connection with a video visit is poor, the visit may have to be switched to a telephone visit. With either a video or telephone visit, we are not always able to ensure that we have a secure connection.  By engaging in this virtual visit, you consent to the provision of healthcare and authorize for your insurance to be billed (if applicable) for the services provided during this visit. Depending on your insurance coverage, you may receive a charge related to this service.  I need to obtain your verbal consent now. Are you willing to proceed with your visit today? Gwendolen Tijerina has provided verbal consent on 06/19/2023 for a virtual visit (video or telephone). Viviano Simas, FNP  Date: 06/19/2023 5:11 PM  Virtual Visit via Video Note   I, Viviano Simas, connected with  Donna Bradshaw  ([id number], 06-02-1985) on 06/19/23 at  5:15 PM EST by a video-enabled telemedicine application and verified that I am speaking with the correct person using two identifiers.  Location: Patient: Virtual Visit Location Patient:  Home Provider: Virtual Visit Location Provider: Home Office   I discussed the limitations of evaluation and management by telemedicine and the availability of in person appointments. The patient expressed understanding and agreed to proceed.    History of Present Illness: Donna Bradshaw is a 38 y.o. who identifies as a female who was assigned female at birth, and is being seen today for concerns over recent symptoms including: sore throat initially that has since resolved, that progressed into sinus congestion/runny nose, no chest congestion or cough  She denies a fever Has been having headaches Has not had a sinus infection in the past   Symptom onset was one week ago   She has tried multiple over the counter medications without relief  She has been using Afrin has stopped   Problems:  Patient Active Problem List   Diagnosis Date Noted   Abdominal bloating 02/02/2022   Chronic idiopathic constipation 02/02/2022   Left lower quadrant pain 02/02/2022   Obesity 02/02/2022   Abnormal TSH 09/14/2019   Vitamin D insufficiency 09/14/2019   Migraine without aura and without status migrainosus, not intractable 05/08/2017   Benign essential HTN 05/08/2017   S/P myomectomy 03/05/2017   Uterine leiomyoma 12/19/2015   Migraine 12/19/2015   Migraine without aura 06/14/2015   Hyperthyroidism 07/21/2014    Allergies: No Known Allergies Medications:  Current Outpatient Medications:    amLODipine (NORVASC) 5 MG tablet, Take 1 tablet (5 mg total) by mouth daily., Disp: 90 tablet, Rfl: 3   cyclobenzaprine (FLEXERIL) 10 MG tablet,  Take 1 tablet (10 mg total) by mouth at bedtime. And headaches., Disp: 90 tablet, Rfl: 3   ibuprofen (ADVIL,MOTRIN) 200 MG tablet, Take 600-800 mg by mouth as needed (for cramps)., Disp: , Rfl:    LARIN 24 FE 1-20 MG-MCG(24) tablet, Take 1 tablet by mouth daily. (Patient not taking: Reported on 07/19/2022), Disp: , Rfl:    levothyroxine (SYNTHROID) 75 MCG tablet, Take 1  tablet (75 mcg total) by mouth daily., Disp: 90 tablet, Rfl: 3   meclizine (ANTIVERT) 12.5 MG tablet, Take 1 tablet (12.5 mg total) by mouth 3 (three) times daily as needed for dizziness., Disp: 30 tablet, Rfl: 0   Multiple Vitamin (MULTIVITAMIN) capsule, Take by mouth., Disp: , Rfl:    naproxen sodium (ALEVE) 220 MG tablet, Take 440 mg by mouth as needed., Disp: , Rfl:    naratriptan (AMERGE) 2.5 MG tablet, Take 1 tablet by mouth at onset of headache; if returns or does not resolve, may repeat after 4 hours; do not exceed 2 tablets in 24 hours., Disp: 9 tablet, Rfl: 3   triamcinolone cream (KENALOG) 0.5 %, APPLY CREAM TOPICALLY TO AFFECTED AREA 4 TIMES DAILY, Disp: 30 g, Rfl: 2   Vitamin D, Cholecalciferol, 25 MCG (1000 UT) TABS, Take by mouth. 2,000 units daily, Disp: , Rfl:   Observations/Objective: Patient is well-developed, well-nourished in no acute distress.  Resting comfortably  at home.  Head is normocephalic, atraumatic.  No labored breathing.  Speech is clear and coherent with logical content.  Patient is alert and oriented at baseline.    Assessment and Plan:   1. Acute non-recurrent pansinusitis (Primary)  May continue to use OTC decongestant as needed   - amoxicillin-clavulanate (AUGMENTIN) 875-125 MG tablet; Take 1 tablet by mouth 2 (two) times daily for 7 days.  Dispense: 14 tablet; Refill: 0 - fluticasone (FLONASE) 50 MCG/ACT nasal spray; Place 2 sprays into both nostrils daily.  Dispense: 16 g; Refill: 6     Follow Up Instructions: I discussed the assessment and treatment plan with the patient. The patient was provided an opportunity to ask questions and all were answered. The patient agreed with the plan and demonstrated an understanding of the instructions.  A copy of instructions were sent to the patient via MyChart unless otherwise noted below.    The patient was advised to call back or seek an in-person evaluation if the symptoms worsen or if the condition fails  to improve as anticipated.    Viviano Simas, FNP

## 2023-08-08 ENCOUNTER — Ambulatory Visit

## 2023-08-08 VITALS — BP 138/109 | Temp 99.1°F | Ht 64.0 in | Wt 196.6 lb

## 2023-08-08 DIAGNOSIS — N898 Other specified noninflammatory disorders of vagina: Secondary | ICD-10-CM | POA: Insufficient documentation

## 2023-08-09 ENCOUNTER — Other Ambulatory Visit: Payer: Self-pay | Admitting: Nurse Practitioner

## 2023-08-09 MED ORDER — MUPIROCIN 2 % EX OINT: 1.0000 | TOPICAL_OINTMENT | Freq: Two times a day (BID) | CUTANEOUS | 0 refills | Status: DC

## 2023-08-09 MED ORDER — METRONIDAZOLE 500 MG PO TABS: 500.0000 mg | ORAL_TABLET | Freq: Two times a day (BID) | ORAL | 0 refills | Status: AC

## 2023-08-11 DIAGNOSIS — J029 Acute pharyngitis, unspecified: Secondary | ICD-10-CM | POA: Diagnosis not present

## 2023-08-11 LAB — NUSWAB VAGINITIS PLUS (VG+)
Chlamydia trachomatis, NAA: NEGATIVE
Neisseria gonorrhoeae, NAA: NEGATIVE
Trich vag by NAA: NEGATIVE

## 2023-08-12 ENCOUNTER — Other Ambulatory Visit: Payer: Self-pay | Admitting: Nurse Practitioner

## 2023-08-12 DIAGNOSIS — B3731 Acute candidiasis of vulva and vagina: Secondary | ICD-10-CM

## 2023-08-12 MED ORDER — FLUCONAZOLE 150 MG PO TABS
150.0000 mg | ORAL_TABLET | Freq: Once | ORAL | 0 refills | Status: DC
Start: 2023-08-12 — End: 2023-08-12

## 2023-08-12 MED ORDER — FLUCONAZOLE 150 MG PO TABS
ORAL_TABLET | ORAL | 0 refills | Status: DC
Start: 2023-08-12 — End: 2023-11-27

## 2023-08-13 DIAGNOSIS — Z113 Encounter for screening for infections with a predominantly sexual mode of transmission: Secondary | ICD-10-CM | POA: Diagnosis not present

## 2023-08-13 DIAGNOSIS — N765 Ulceration of vagina: Secondary | ICD-10-CM | POA: Diagnosis not present

## 2023-08-13 DIAGNOSIS — J039 Acute tonsillitis, unspecified: Secondary | ICD-10-CM | POA: Diagnosis not present

## 2023-08-19 ENCOUNTER — Telehealth: Payer: Self-pay

## 2023-08-19 NOTE — Telephone Encounter (Unsigned)
 Copied from CRM [id number]. Topic: Clinical - Request for Lab/Test Order >> Aug 16, 2023  4:42 PM [name] wrote: Reason for CRM: Donna Bradshaw is calling from [facility] received samples with no test code [id number] [phone]

## 2023-09-11 ENCOUNTER — Ambulatory Visit: Payer: Self-pay | Admitting: Nurse Practitioner

## 2023-09-11 ENCOUNTER — Other Ambulatory Visit (HOSPITAL_COMMUNITY): Payer: Self-pay

## 2023-09-11 ENCOUNTER — Encounter: Payer: Self-pay | Admitting: Nurse Practitioner

## 2023-09-11 VITALS — BP 123/90 | HR 77 | Temp 98.6°F | Wt 199.8 lb

## 2023-09-11 DIAGNOSIS — Z13220 Encounter for screening for lipoid disorders: Secondary | ICD-10-CM

## 2023-09-11 DIAGNOSIS — E039 Hypothyroidism, unspecified: Secondary | ICD-10-CM

## 2023-09-11 MED ORDER — FLUCONAZOLE 150 MG PO TABS
150.0000 mg | ORAL_TABLET | Freq: Every day | ORAL | 0 refills | Status: DC
  Filled 2023-09-11: qty 1, 1d supply, fill #0

## 2023-09-11 MED ORDER — VALACYCLOVIR HCL 500 MG PO TABS
500.0000 mg | ORAL_TABLET | Freq: Two times a day (BID) | ORAL | 1 refills | Status: DC
  Filled 2023-09-11: qty 90, 31d supply, fill #0
  Filled 2023-10-17: qty 90, 31d supply, fill #1

## 2023-09-11 MED ORDER — NARATRIPTAN HCL 2.5 MG PO TABS
ORAL_TABLET | ORAL | 3 refills | Status: DC
  Filled 2023-09-11: qty 9, 30d supply, fill #0

## 2023-09-11 NOTE — Progress Notes (Signed)
 Subjective   Patient ID: Donna Bradshaw, female    DOB: 10/26/1985, 38 y.o.   MRN: [id number]  Chief Complaint  Patient presents with   HSV    Patient just found out she has HSV   Vaginitis    Referring provider: Ivonne Andrew, NP  Krupa Stege is a 38 y.o. female with Past Medical History: No date: Asthma No date: Hypertension No date: Hyperthyroidism     Comment:  no longer an issue No date: Migraine No date: Uterine fibroid   HPI  Hypertension:    Patient here for follow-up of elevated blood pressure. She is exercising and is adherent to low salt diet.  Denies checking B/P at home. Cardiac symptoms none. Patient denies none. Cardiovascular risk factors: hypertension and obesity (BMI >= 30 kg/m2). Use of agents associated with hypertension: none. History of target organ damage: none. Denies any other concerns today. Questions whether she needed to have her B/P medication adjusted.    Hypothyroidism:    Continues on synthroid. Will recheck thyroid panel today. No new issues or concerns.   Note: Patient states that she did recently have an outbreak of genital herpes for the first time recently.  She was prescribed valacyclovir.  She states that this did help but she has had another outbreak this month.  We will order valacyclovir for suppression.  She also needs a Diflucan for yeast.   Denies f/c/s, n/v/d, hemoptysis, PND, leg swelling Denies chest pain or edema  No Known Allergies  Immunization History  Administered Date(s) Administered   Influenza, Quadrivalent, Recombinant, Inj, Pf 03/02/2018   Influenza,inj,Quad PF,6+ Mos 02/17/2017, 01/29/2019   Influenza,inj,quad, With Preservative 02/26/2015   Influenza-Unspecified 02/26/2015, 02/17/2017, 02/25/2018, 02/23/2020   PFIZER(Purple Top)SARS-COV-2 Vaccination 07/17/2019, 08/07/2019   Td 11/05/2017   Td (Adult),5 Lf Tetanus Toxid, Preservative Free 11/05/2017   Tdap 12/31/2017   Typhoid Inactivated 01/03/2023    Yellow Fever 01/03/2023    Tobacco History: Social History   Tobacco Use  Smoking Status Never  Smokeless Tobacco Never   Counseling given: Not Answered   Outpatient Encounter Medications as of 09/11/2023  Medication Sig   amLODipine (NORVASC) 5 MG tablet Take 1 tablet (5 mg total) by mouth daily.   cyclobenzaprine (FLEXERIL) 10 MG tablet Take 1 tablet (10 mg total) by mouth at bedtime. And headaches.   fluconazole (DIFLUCAN) 150 MG tablet Take one tablet(150 mg) by mouth once. May repeat after 72 hours if symptoms persist   fluconazole (DIFLUCAN) 150 MG tablet Take 1 tablet (150 mg total) by mouth daily.   ibuprofen (ADVIL,MOTRIN) 200 MG tablet Take 600-800 mg by mouth as needed (for cramps).   levothyroxine (SYNTHROID) 75 MCG tablet Take 1 tablet (75 mcg total) by mouth daily.   Multiple Vitamin (MULTIVITAMIN) capsule Take by mouth.   mupirocin ointment (BACTROBAN) 2 % Apply 1 Application topically 2 (two) times daily.   naproxen sodium (ALEVE) 220 MG tablet Take 440 mg by mouth as needed.   triamcinolone cream (KENALOG) 0.5 % APPLY CREAM TOPICALLY TO AFFECTED AREA 4 TIMES DAILY   valACYclovir (VALTREX) 500 MG tablet Take 1 tablet (500 mg total) by mouth 2 (two) times daily. Take 2 tablets 3 times daily for 1 week then take 1 tablet twice daily for suppression   Vitamin D, Cholecalciferol, 25 MCG (1000 UT) TABS Take by mouth. 2,000 units daily   fluticasone (FLONASE) 50 MCG/ACT nasal spray Place 2 sprays into both nostrils daily. (Patient not taking: Reported on 09/11/2023)  LARIN 24 FE 1-20 MG-MCG(24) tablet Take 1 tablet by mouth daily. (Patient not taking: Reported on 09/11/2023)   meclizine (ANTIVERT) 12.5 MG tablet Take 1 tablet (12.5 mg total) by mouth 3 (three) times daily as needed for dizziness. (Patient not taking: Reported on 09/11/2023)   naratriptan (AMERGE) 2.5 MG tablet Take 1 tablet by mouth at onset of headache; if returns or does not resolve, may repeat after 4  hours; do not exceed 2 tablets in 24 hours.   [DISCONTINUED] naratriptan (AMERGE) 2.5 MG tablet Take 1 tablet by mouth at onset of headache; if returns or does not resolve, may repeat after 4 hours; do not exceed 2 tablets in 24 hours. (Patient not taking: Reported on 09/11/2023)   No facility-administered encounter medications on file as of 09/11/2023.    Review of Systems  Review of Systems  Constitutional: Negative.   HENT: Negative.    Cardiovascular: Negative.   Gastrointestinal: Negative.   Allergic/Immunologic: Negative.   Neurological: Negative.   Psychiatric/Behavioral: Negative.       Objective:   BP (!) 123/90   Pulse 77   Temp 98.6 F (37 C) (Oral)   Wt 199 lb 12.8 oz (90.6 kg)   SpO2 100%   BMI 34.30 kg/m   Wt Readings from Last 5 Encounters:  09/11/23 199 lb 12.8 oz (90.6 kg)  08/08/23 196 lb 9.6 oz (89.2 kg)  03/13/23 198 lb (89.8 kg)  07/19/22 203 lb (92.1 kg)  11/06/21 195 lb 6.4 oz (88.6 kg)     Physical Exam Vitals and nursing note reviewed.  Constitutional:      General: She is not in acute distress.    Appearance: She is well-developed.  Cardiovascular:     Rate and Rhythm: Normal rate and regular rhythm.  Pulmonary:     Effort: Pulmonary effort is normal.     Breath sounds: Normal breath sounds.  Neurological:     Mental Status: She is alert and oriented to person, place, and time.       Assessment & Plan:   Hypothyroidism, unspecified type -     CBC -     Comprehensive metabolic panel with GFR -     Thyroid Panel With TSH  Lipid screening -     Lipid panel  Other orders -     Naratriptan HCl; Take 1 tablet by mouth at onset of headache; if returns or does not resolve, may repeat after 4 hours; do not exceed 2 tablets in 24 hours.  Dispense: 9 tablet; Refill: 3 -     valACYclovir HCl; Take 1 tablet (500 mg total) by mouth 2 (two) times daily. Take 2 tablets 3 times daily for 1 week then take 1 tablet twice daily for suppression   Dispense: 90 tablet; Refill: 1 -     Fluconazole; Take 1 tablet (150 mg total) by mouth daily.  Dispense: 1 tablet; Refill: 0     Return in about 3 months (around 12/11/2023) for recheck kidney function.   Jerrlyn Morel, NP 09/11/2023

## 2023-09-11 NOTE — Patient Instructions (Signed)
 1. Hypothyroidism, unspecified type (Primary)  - CBC - Comprehensive metabolic panel with GFR - Thyroid Panel With TSH  2. Lipid screening  - Lipid Panel

## 2023-09-12 ENCOUNTER — Other Ambulatory Visit (HOSPITAL_COMMUNITY): Payer: Self-pay

## 2023-09-12 LAB — THYROID PANEL WITH TSH
Free Thyroxine Index: 2.2 (ref 1.2–4.9)
T3 Uptake Ratio: 28 % (ref 24–39)
T4, Total: 8 ug/dL (ref 4.5–12.0)
TSH: 2.35 u[IU]/mL (ref 0.450–4.500)

## 2023-09-12 LAB — COMPREHENSIVE METABOLIC PANEL WITH GFR
ALT: 16 IU/L (ref 0–32)
AST: 17 IU/L (ref 0–40)
Albumin: 4.3 g/dL (ref 3.9–4.9)
Alkaline Phosphatase: 54 IU/L (ref 44–121)
BUN/Creatinine Ratio: 10 (ref 9–23)
BUN: 11 mg/dL (ref 6–20)
Bilirubin Total: 0.4 mg/dL (ref 0.0–1.2)
CO2: 23 mmol/L (ref 20–29)
Calcium: 9.4 mg/dL (ref 8.7–10.2)
Chloride: 101 mmol/L (ref 96–106)
Creatinine, Ser: 1.08 mg/dL — ABNORMAL HIGH (ref 0.57–1.00)
Globulin, Total: 2.7 g/dL (ref 1.5–4.5)
Glucose: 102 mg/dL — ABNORMAL HIGH (ref 70–99)
Potassium: 4.5 mmol/L (ref 3.5–5.2)
Sodium: 139 mmol/L (ref 134–144)
Total Protein: 7 g/dL (ref 6.0–8.5)
eGFR: 67 mL/min/{1.73_m2} (ref >59)

## 2023-09-12 LAB — CBC
Hematocrit: 35 % (ref 34.0–46.6)
Hemoglobin: 11.6 g/dL (ref 11.1–15.9)
MCH: 28 pg (ref 26.6–33.0)
MCHC: 33.1 g/dL (ref 31.5–35.7)
MCV: 84 fL (ref 79–97)
Platelets: 348 10*3/uL (ref 150–450)
RBC: 4.15 x10E6/uL (ref 3.77–5.28)
RDW: 13 % (ref 11.7–15.4)
WBC: 6.8 10*3/uL (ref 3.4–10.8)

## 2023-09-12 LAB — LIPID PANEL
Chol/HDL Ratio: 3.1 ratio (ref 0.0–4.4)
Cholesterol, Total: 140 mg/dL (ref 100–199)
HDL: 45 mg/dL (ref >39)
LDL Chol Calc (NIH): 80 mg/dL (ref 0–99)
Triglycerides: 73 mg/dL (ref 0–149)
VLDL Cholesterol Cal: 15 mg/dL (ref 5–40)

## 2023-10-03 ENCOUNTER — Other Ambulatory Visit: Payer: Self-pay | Admitting: Nurse Practitioner

## 2023-11-19 ENCOUNTER — Ambulatory Visit
Admission: EM | Admit: 2023-11-19 | Discharge: 2023-11-19 | Disposition: A | Discharge status: Home / Self Care | Attending: Family Medicine | Admitting: Family Medicine

## 2023-11-19 ENCOUNTER — Other Ambulatory Visit: Payer: Self-pay

## 2023-11-19 DIAGNOSIS — J029 Acute pharyngitis, unspecified: Secondary | ICD-10-CM | POA: Insufficient documentation

## 2023-11-19 DIAGNOSIS — B349 Viral infection, unspecified: Secondary | ICD-10-CM | POA: Diagnosis not present

## 2023-11-19 LAB — POCT RAPID STREP A (OFFICE): Rapid Strep A Screen: NEGATIVE

## 2023-11-19 LAB — POC SARS CORONAVIRUS 2 AG -  ED: SARS Coronavirus 2 Ag: NEGATIVE

## 2023-11-19 NOTE — Discharge Instructions (Signed)
 You tested negative for COVID and strep throat.  The clinical contact you with results of the strep throat culture done today are positive, typically takes about 2 days.  You may do salt water gargles and warm liquids such as teas and honey.  Over-the-counter Tylenol  or ibuprofen  as well.  Lots of rest and fluids.  Please follow-up with your PCP if your symptoms do not improve.  Please go to the ER for any worsening symptoms.  Hope you feel better soon!

## 2023-11-19 NOTE — ED Provider Notes (Signed)
 [facility]    CSN: [id number] Arrival date & time: 11/19/23  0844      History   Chief Complaint No chief complaint on file.   HPI Donna Bradshaw is a 38 y.o. female  presents for evaluation of URI symptoms for 1 days. Patient reports associated symptoms of sore throat, body aches. Denies N/V/D, cough, congestion, fevers, ear pain, shortness of breath. Patient does not have a hx of asthma. Patient is not an active smoker.   Reports no sick contacts.  Pt has taken ibuprofen  OTC for symptoms. Pt has no other concerns at this time.   HPI  Past Medical History:  Diagnosis Date   Asthma    Hypertension    Hyperthyroidism    no longer an issue   Migraine    Uterine fibroid     Patient Active Problem List   Diagnosis Date Noted   Vaginal irritation 08/08/2023   Abdominal bloating 02/02/2022   Chronic idiopathic constipation 02/02/2022   Left lower quadrant pain 02/02/2022   Obesity 02/02/2022   Abnormal TSH 09/14/2019   Vitamin D insufficiency 09/14/2019   Migraine without aura and without status migrainosus, not intractable 05/08/2017   Benign essential HTN 05/08/2017   S/P myomectomy 03/05/2017   Uterine leiomyoma 12/19/2015   Migraine 12/19/2015   Migraine without aura 06/14/2015   Hyperthyroidism 07/21/2014    Past Surgical History:  Procedure Laterality Date   MYOMECTOMY N/A 03/05/2017   Procedure: MYOMECTOMY, ABDOMINAL;  Surgeon: Dannielle Bouchard, DO;  Location: [facility];  Service: Gynecology;  Laterality: N/A;   WISDOM TOOTH EXTRACTION      OB History     Gravida  0   Para  0   Term  0   Preterm  0   AB  0   Living  0      SAB  0   IAB  0   Ectopic  0   Multiple  0   Live Births  0            Home Medications    Prior to Admission medications   Medication Sig Start Date End Date Taking? Authorizing Provider  L-Lysine 1000 MG TABS Take 1,000 mg by mouth daily.   Yes [provider]  amLODipine  (NORVASC ) 5 MG  tablet Take 1 tablet (5 mg total) by mouth daily. 03/13/23 03/12/24  Oley Bascom RAMAN, NP  cyclobenzaprine  (FLEXERIL ) 10 MG tablet Take 1 tablet (10 mg total) by mouth at bedtime. And headaches. 07/19/22   Oley Bascom RAMAN, NP  fluconazole  (DIFLUCAN ) 150 MG tablet Take one tablet(150 mg) by mouth once. May repeat after 72 hours if symptoms persist 08/12/23   Paseda, Folashade R, FNP  fluconazole  (DIFLUCAN ) 150 MG tablet Take 1 tablet (150 mg total) by mouth daily. 09/11/23   Nichols, Tonya S, NP  fluticasone  (FLONASE ) 50 MCG/ACT nasal spray Place 2 sprays into both nostrils daily. Patient not taking: Reported on 09/11/2023 06/19/23   Kennyth Domino, FNP  ibuprofen  (ADVIL ,MOTRIN ) 200 MG tablet Take 600-800 mg by mouth as needed (for cramps).    [provider]  LARIN 24 FE 1-20 MG-MCG(24) tablet Take 1 tablet by mouth daily. Patient not taking: Reported on 09/11/2023 01/31/22   [provider]  levothyroxine  (SYNTHROID ) 75 MCG tablet Take 1 tablet (75 mcg total) by mouth daily. 03/13/23   Oley Bascom RAMAN, NP  meclizine  (ANTIVERT ) 12.5 MG tablet Take 1 tablet (12.5 mg total) by mouth 3 (three) times daily  as needed for dizziness. Patient not taking: Reported on 09/11/2023 03/13/23   Oley Bascom RAMAN, NP  Multiple Vitamin (MULTIVITAMIN) capsule Take by mouth.    [provider]  mupirocin  ointment (BACTROBAN ) 2 % Apply 1 Application topically 2 (two) times daily. 08/09/23   Nichols, Tonya S, NP  naproxen sodium (ALEVE) 220 MG tablet Take 440 mg by mouth as needed.    [provider]  naratriptan  (AMERGE) 2.5 MG tablet TAKE 1 TABLET BY MOUTH AT ONSET OF HEADACHE; IF RETURNS OR DOES NOT RESOLVE, MAY REPEAT AFTER 4 HOURS; DO NOT EXCEED 2 TABLETS IN 24 HOURS 10/04/23   Oley Bascom RAMAN, NP  triamcinolone  cream (KENALOG ) 0.5 % APPLY CREAM TOPICALLY TO AFFECTED AREA 4 TIMES DAILY 03/13/23   Nichols, Tonya S, NP  valACYclovir  (VALTREX ) 500 MG tablet Take 1 tablet (500 mg total) by  mouth 2 (two) times daily. Take 2 tablets 3 times daily for 1 week then take 1 tablet twice daily for suppression 09/11/23   Nichols, Tonya S, NP  Vitamin D, Cholecalciferol, 25 MCG (1000 UT) TABS Take by mouth. 2,000 units daily    [provider]    Family History Family History  Problem Relation Age of Onset   Diabetes Maternal Uncle    Diabetes Maternal Grandmother    Breast cancer Paternal Grandmother    Cancer Other    Seizures Other    Migraines Neg Hx    Colon cancer Neg Hx    Stomach cancer Neg Hx    Pancreatic cancer Neg Hx    Esophageal cancer Neg Hx    Rectal cancer Neg Hx     Social History Social History   Tobacco Use   Smoking status: Never   Smokeless tobacco: Never  Vaping Use   Vaping status: Never Used  Substance Use Topics   Alcohol use: Yes    Alcohol/week: 0.0 standard drinks of alcohol    Comment: occ   Drug use: No     Allergies   Patient has no known allergies.   Review of Systems Review of Systems  HENT:  Positive for sore throat.   Musculoskeletal:  Positive for myalgias.     Physical Exam Triage Vital Signs ED Triage Vitals  Encounter Vitals Group     BP 11/19/23 0912 (!) 143/93     Girls Systolic BP Percentile --      Girls Diastolic BP Percentile --      Boys Systolic BP Percentile --      Boys Diastolic BP Percentile --      Pulse Rate 11/19/23 0912 (!) 113     Resp 11/19/23 0912 16     Temp 11/19/23 0912 98.7 F (37.1 C)     Temp Source 11/19/23 0912 Oral     SpO2 11/19/23 0912 97 %     Weight --      Height --      Head Circumference --      Peak Flow --      Pain Score 11/19/23 0908 8     Pain Loc --      Pain Education --      Exclude from Growth Chart --    No data found.  Updated Vital Signs BP (!) 143/93   Pulse (!) 113   Temp 98.7 F (37.1 C) (Oral)   Resp 16   LMP 11/04/2023   SpO2 97%   Visual Acuity Right Eye Distance:   Left Eye Distance:  Bilateral Distance:    Right Eye Near:    Left Eye Near:    Bilateral Near:     Physical Exam Vitals and nursing note reviewed.  Constitutional:      General: She is not in acute distress.    Appearance: She is well-developed. She is not ill-appearing.  HENT:     Head: Normocephalic and atraumatic.     Right Ear: Tympanic membrane and ear canal normal.     Left Ear: Tympanic membrane and ear canal normal.     Nose: No congestion or rhinorrhea.     Mouth/Throat:     Mouth: Mucous membranes are moist.     Pharynx: Oropharynx is clear. Uvula midline. Posterior oropharyngeal erythema present.     Tonsils: No tonsillar exudate or tonsillar abscesses.   Eyes:     Conjunctiva/sclera: Conjunctivae normal.     Pupils: Pupils are equal, round, and reactive to light.    Cardiovascular:     Rate and Rhythm: Normal rate and regular rhythm.     Heart sounds: Normal heart sounds.  Pulmonary:     Effort: Pulmonary effort is normal.     Breath sounds: Normal breath sounds.   Musculoskeletal:     Cervical back: Normal range of motion and neck supple.  Lymphadenopathy:     Cervical: No cervical adenopathy.   Skin:    General: Skin is warm and dry.   Neurological:     General: No focal deficit present.     Mental Status: She is alert and oriented to person, place, and time.   Psychiatric:        Mood and Affect: Mood normal.        Behavior: Behavior normal.      UC Treatments / Results  Labs (all labs ordered are listed, but only abnormal results are displayed) Labs Reviewed  CULTURE, GROUP A STREP [facility])  POCT RAPID STREP A (OFFICE)  POC SARS CORONAVIRUS 2 AG -  ED    EKG   Radiology No results found.  Procedures Procedures (including critical care time)  Medications Ordered in UC Medications - No data to display  Initial Impression / Assessment and Plan / UC Course  I have reviewed the triage vital signs and the nursing notes.  Pertinent labs & imaging results that were available during my care of  the patient were reviewed by me and considered in my medical decision making (see chart for details).  Clinical Course as of 11/19/23 0953  Tue Nov 19, 2023  0952 Heart rate recheck 94 [JM]    Clinical Course User Index [JM] Loreda Myla SAUNDERS, NP    Reviewed exam and symptoms with patient.  No red flags.  Negative rapid COVID and flu testing.  Will send strep throat culture.  Discussed viral illness and symptomatic treatment.  PCP follow-up if symptoms do not improve.  ER precautions reviewed. Final Clinical Impressions(s) / UC Diagnoses   Final diagnoses:  Sore throat  Viral illness     Discharge Instructions      You tested negative for COVID and strep throat.  The clinical contact you with results of the strep throat culture done today are positive, typically takes about 2 days.  You may do salt water gargles and warm liquids such as teas and honey.  Over-the-counter Tylenol  or ibuprofen  as well.  Lots of rest and fluids.  Please follow-up with your PCP if your symptoms do not improve.  Please go to the ER  for any worsening symptoms.  Hope you feel better soon!     ED Prescriptions   None    PDMP not reviewed this encounter.   Loreda Myla SAUNDERS, NP 11/19/23 [phone]

## 2023-11-19 NOTE — ED Triage Notes (Signed)
 Pt c/o sore throat, tonsils are swollen, bodyaches started yesterday. Pt states, my tonsils look black.

## 2023-11-20 DIAGNOSIS — J029 Acute pharyngitis, unspecified: Secondary | ICD-10-CM | POA: Diagnosis not present

## 2023-11-20 LAB — CULTURE, GROUP A STREP (THRC)

## 2023-11-22 ENCOUNTER — Ambulatory Visit (HOSPITAL_COMMUNITY): Payer: Self-pay

## 2023-11-27 ENCOUNTER — Other Ambulatory Visit: Payer: Self-pay | Admitting: Nurse Practitioner

## 2023-11-27 ENCOUNTER — Other Ambulatory Visit (HOSPITAL_COMMUNITY): Payer: Self-pay

## 2023-11-27 ENCOUNTER — Ambulatory Visit: Payer: Self-pay

## 2023-11-27 MED ORDER — FLUCONAZOLE 150 MG PO TABS
150.0000 mg | ORAL_TABLET | Freq: Every day | ORAL | 0 refills | Status: DC
  Filled 2023-11-27: qty 1, 1d supply, fill #0

## 2023-11-27 MED ORDER — FLUCONAZOLE 150 MG PO TABS: 150.0000 mg | ORAL_TABLET | Freq: Once | ORAL | 0 refills | Status: AC

## 2023-11-27 NOTE — Telephone Encounter (Signed)
 Patient called, left VM to return the call to the office to speak to NT.    Also looks like medication has been sent into pharmacy at 0908 this morning   Copied from CRM #[id number]. Topic: Clinical - Medication Question >> Nov 27, 2023  8:56 AM Rosaria E wrote: Reason for CRM: Pt just finished and antibiotic and needs medication for a yeast infection. Please advise  [phone]  [facility] [address] [place] [address] Phone: [phone] Fax: [phone]     ----------------------------------------------------------------------- From previous Reason for Contact - Prescription Issue: Reason for CRM:

## 2023-11-27 NOTE — Telephone Encounter (Signed)
   FYI Only or Action Required?: FYI only for provider.  Patient was last seen in primary care on 09/11/2023 by Oley Bascom RAMAN, NP. Called Nurse Triage reporting Vaginal Discharge. Symptoms began today. Interventions attempted: Nothing. Symptoms are: stable.  Triage Disposition: No disposition on file. Med sent prior to NT. See note.   Patient/caregiver understands and will follow disposition?: yes.  --------------------------------------------------- Spoke to patient.  Relayed that Diflucan  was already sent appropriately to her preferred pharmacy.  Patient verbalized understanding.   No additional questions/concerns noted during the time of the call.

## 2023-11-27 NOTE — Telephone Encounter (Signed)
 Wrong office

## 2023-12-11 ENCOUNTER — Other Ambulatory Visit (HOSPITAL_COMMUNITY): Payer: Self-pay

## 2023-12-11 ENCOUNTER — Encounter: Payer: Self-pay | Admitting: Nurse Practitioner

## 2023-12-11 ENCOUNTER — Ambulatory Visit: Payer: Self-pay | Admitting: Nurse Practitioner

## 2023-12-11 ENCOUNTER — Other Ambulatory Visit (HOSPITAL_BASED_OUTPATIENT_CLINIC_OR_DEPARTMENT_OTHER): Payer: Self-pay

## 2023-12-11 VITALS — BP 126/89 | HR 80 | Temp 98.4°F | Wt 200.0 lb

## 2023-12-11 DIAGNOSIS — A6000 Herpesviral infection of urogenital system, unspecified: Secondary | ICD-10-CM

## 2023-12-11 DIAGNOSIS — I10 Essential (primary) hypertension: Secondary | ICD-10-CM | POA: Diagnosis not present

## 2023-12-11 DIAGNOSIS — Z131 Encounter for screening for diabetes mellitus: Secondary | ICD-10-CM | POA: Diagnosis not present

## 2023-12-11 LAB — POCT GLYCOSYLATED HEMOGLOBIN (HGB A1C): Hemoglobin A1C: 5.6 % (ref 4.0–5.6)

## 2023-12-11 MED ORDER — VALACYCLOVIR HCL 500 MG PO TABS
500.0000 mg | ORAL_TABLET | Freq: Two times a day (BID) | ORAL | 1 refills | Status: DC
Start: 2023-12-11 — End: 2024-03-24
  Filled 2023-12-11: qty 90, 45d supply, fill #0
  Filled 2024-01-15 – 2024-02-13 (×3): qty 90, 45d supply, fill #1

## 2023-12-11 NOTE — Progress Notes (Signed)
 Subjective   Patient ID: Donna Bradshaw, female    DOB: 05-Apr-1986, 38 y.o.   MRN: [id number]  Chief Complaint  Patient presents with   Medical Management of Chronic Issues    Follow up    Referring provider: Oley Bascom RAMAN, NP  Donna Bradshaw is a 38 y.o. female with Past Medical History: No date: Asthma No date: Hypertension No date: Hyperthyroidism     Comment:  no longer an issue No date: Migraine No date: Uterine fibroid   HPI  Hypertension:    Patient here for follow-up of elevated blood pressure. She is exercising and is adherent to low salt diet.  Denies checking B/P at home. Cardiac symptoms none. Patient denies none. Cardiovascular risk factors: hypertension and obesity (BMI >= 30 kg/m2). Use of agents associated with hypertension: none. History of target organ damage: none. Denies any other concerns today. Questions whether she needed to have her B/P medication adjusted.    Hypothyroidism:    Continues on synthroid . Will recheck thyroid  panel today. No new issues or concerns.   Note: Patient states that she did recently have an outbreak of genital herpes for the first time recently.  She was prescribed valacyclovir .  She states that this did help but she has had another outbreak this month.  We will order valacyclovir  for suppression.  She also needs a Diflucan  for yeast.   Denies f/c/s, n/v/d, hemoptysis, PND, leg swelling Denies chest pain or edema    No Known Allergies  Immunization History  Administered Date(s) Administered   Influenza, Quadrivalent, Recombinant, Inj, Pf 03/02/2018   Influenza,inj,Quad PF,6+ Mos 02/17/2017, 01/29/2019   Influenza,inj,quad, With Preservative 02/26/2015   Influenza-Unspecified 02/26/2015, 02/17/2017, 02/25/2018, 02/23/2020   PFIZER(Purple Top)SARS-COV-2 Vaccination 07/17/2019, 08/07/2019   Td 11/05/2017   Td (Adult),5 Lf Tetanus Toxid, Preservative Free 11/05/2017   Tdap 12/31/2017   Typhoid Inactivated 01/03/2023    Yellow Fever 01/03/2023    Tobacco History: Social History   Tobacco Use  Smoking Status Never  Smokeless Tobacco Never   Counseling given: Not Answered   Outpatient Encounter Medications as of 12/11/2023  Medication Sig   amLODipine  (NORVASC ) 5 MG tablet Take 1 tablet (5 mg total) by mouth daily.   cyclobenzaprine  (FLEXERIL ) 10 MG tablet Take 1 tablet (10 mg total) by mouth at bedtime. And headaches.   ibuprofen  (ADVIL ,MOTRIN ) 200 MG tablet Take 600-800 mg by mouth as needed (for cramps).   L-Lysine 1000 MG TABS Take 1,000 mg by mouth daily.   levothyroxine  (SYNTHROID ) 75 MCG tablet Take 1 tablet (75 mcg total) by mouth daily.   Multiple Vitamin (MULTIVITAMIN) capsule Take by mouth.   mupirocin  ointment (BACTROBAN ) 2 % Apply 1 Application topically 2 (two) times daily.   naproxen sodium (ALEVE) 220 MG tablet Take 440 mg by mouth as needed.   naratriptan  (AMERGE) 2.5 MG tablet TAKE 1 TABLET BY MOUTH AT ONSET OF HEADACHE; IF RETURNS OR DOES NOT RESOLVE, MAY REPEAT AFTER 4 HOURS; DO NOT EXCEED 2 TABLETS IN 24 HOURS   triamcinolone  cream (KENALOG ) 0.5 % APPLY CREAM TOPICALLY TO AFFECTED AREA 4 TIMES DAILY   Vitamin D, Cholecalciferol, 25 MCG (1000 UT) TABS Take by mouth. 2,000 units daily   [DISCONTINUED] valACYclovir  (VALTREX ) 500 MG tablet Take 1 tablet (500 mg total) by mouth 2 (two) times daily. Take 2 tablets 3 times daily for 1 week then take 1 tablet twice daily for suppression   fluticasone  (FLONASE ) 50 MCG/ACT nasal spray Place 2 sprays into both nostrils  daily. (Patient not taking: Reported on 09/11/2023)   LARIN 24 FE 1-20 MG-MCG(24) tablet Take 1 tablet by mouth daily. (Patient not taking: Reported on 09/11/2023)   meclizine  (ANTIVERT ) 12.5 MG tablet Take 1 tablet (12.5 mg total) by mouth 3 (three) times daily as needed for dizziness. (Patient not taking: Reported on 09/11/2023)   valACYclovir  (VALTREX ) 500 MG tablet Take 1 tablet (500 mg total) by mouth 2 (two) times daily. Take 2  tablets 3 times daily for 1 week then take 1 tablet twice daily for suppression   No facility-administered encounter medications on file as of 12/11/2023.    Review of Systems  Review of Systems  Constitutional: Negative.   HENT: Negative.    Cardiovascular: Negative.   Gastrointestinal: Negative.   Allergic/Immunologic: Negative.   Neurological: Negative.   Psychiatric/Behavioral: Negative.       Objective:   BP 126/89   Pulse 80   Temp 98.4 F (36.9 C) (Oral)   Wt 200 lb (90.7 kg)   LMP 11/04/2023   SpO2 100%   BMI 34.33 kg/m   Wt Readings from Last 5 Encounters:  12/11/23 200 lb (90.7 kg)  09/11/23 199 lb 12.8 oz (90.6 kg)  08/08/23 196 lb 9.6 oz (89.2 kg)  03/13/23 198 lb (89.8 kg)  07/19/22 203 lb (92.1 kg)     Physical Exam Vitals and nursing note reviewed.  Constitutional:      General: She is not in acute distress.    Appearance: She is well-developed.  Cardiovascular:     Rate and Rhythm: Normal rate and regular rhythm.  Pulmonary:     Effort: Pulmonary effort is normal.     Breath sounds: Normal breath sounds.  Neurological:     Mental Status: She is alert and oriented to person, place, and time.       Assessment & Plan:   Screening for diabetes mellitus -     POCT glycosylated hemoglobin (Hb A1C)  Benign essential HTN -     Comprehensive metabolic panel with GFR  Genital herpes simplex, unspecified site -     valACYclovir  HCl; Take 1 tablet (500 mg total) by mouth 2 (two) times daily. Take 2 tablets 3 times daily for 1 week then take 1 tablet twice daily for suppression  Dispense: 90 tablet; Refill: 1 -     Ambulatory referral to Obstetrics / Gynecology     Return in about 6 months (around 06/12/2024).   Bascom GORMAN Borer, NP 12/11/2023

## 2023-12-12 ENCOUNTER — Encounter (HOSPITAL_BASED_OUTPATIENT_CLINIC_OR_DEPARTMENT_OTHER): Payer: Self-pay

## 2023-12-12 ENCOUNTER — Ambulatory Visit: Payer: Self-pay | Admitting: Nurse Practitioner

## 2023-12-12 LAB — COMPREHENSIVE METABOLIC PANEL WITH GFR
ALT: 17 IU/L (ref 0–32)
AST: 20 IU/L (ref 0–40)
Albumin: 4.3 g/dL (ref 3.9–4.9)
Alkaline Phosphatase: 52 IU/L (ref 44–121)
BUN/Creatinine Ratio: 9 (ref 9–23)
BUN: 8 mg/dL (ref 6–20)
Bilirubin Total: 0.3 mg/dL (ref 0.0–1.2)
CO2: 23 mmol/L (ref 20–29)
Calcium: 9.9 mg/dL (ref 8.7–10.2)
Chloride: 99 mmol/L (ref 96–106)
Creatinine, Ser: 0.88 mg/dL (ref 0.57–1.00)
Globulin, Total: 2.4 g/dL (ref 1.5–4.5)
Glucose: 99 mg/dL (ref 70–99)
Potassium: 3.6 mmol/L (ref 3.5–5.2)
Sodium: 138 mmol/L (ref 134–144)
Total Protein: 6.7 g/dL (ref 6.0–8.5)
eGFR: 86 mL/min/1.73 (ref >59)

## 2023-12-17 ENCOUNTER — Encounter (HOSPITAL_BASED_OUTPATIENT_CLINIC_OR_DEPARTMENT_OTHER): Payer: Self-pay

## 2023-12-31 ENCOUNTER — Other Ambulatory Visit (HOSPITAL_COMMUNITY): Payer: Self-pay

## 2023-12-31 ENCOUNTER — Encounter (HOSPITAL_BASED_OUTPATIENT_CLINIC_OR_DEPARTMENT_OTHER): Payer: Self-pay | Admitting: Certified Nurse Midwife

## 2023-12-31 ENCOUNTER — Ambulatory Visit (HOSPITAL_BASED_OUTPATIENT_CLINIC_OR_DEPARTMENT_OTHER): Admitting: Certified Nurse Midwife

## 2023-12-31 VITALS — BP 136/89 | HR 85 | Ht 64.0 in | Wt 206.0 lb

## 2023-12-31 DIAGNOSIS — N920 Excessive and frequent menstruation with regular cycle: Secondary | ICD-10-CM | POA: Diagnosis not present

## 2023-12-31 DIAGNOSIS — D219 Benign neoplasm of connective and other soft tissue, unspecified: Secondary | ICD-10-CM | POA: Diagnosis not present

## 2023-12-31 DIAGNOSIS — Z9889 Other specified postprocedural states: Secondary | ICD-10-CM | POA: Diagnosis not present

## 2023-12-31 DIAGNOSIS — N898 Other specified noninflammatory disorders of vagina: Secondary | ICD-10-CM

## 2023-12-31 DIAGNOSIS — B009 Herpesviral infection, unspecified: Secondary | ICD-10-CM

## 2023-12-31 DIAGNOSIS — B3731 Acute candidiasis of vulva and vagina: Secondary | ICD-10-CM | POA: Diagnosis not present

## 2023-12-31 MED ORDER — FLUCONAZOLE 150 MG PO TABS
150.0000 mg | ORAL_TABLET | ORAL | 12 refills | Status: DC | PRN
  Filled 2023-12-31: qty 3, 6d supply, fill #0
  Filled 2024-01-15: qty 3, 6d supply, fill #1
  Filled 2024-02-04: qty 3, 6d supply, fill #2
  Filled 2024-02-13: qty 3, 6d supply, fill #3
  Filled 2024-03-05: qty 3, 6d supply, fill #4
  Filled 2024-03-24: qty 3, 6d supply, fill #5

## 2023-12-31 MED ORDER — ACYCLOVIR 400 MG PO TABS
400.0000 mg | ORAL_TABLET | Freq: Two times a day (BID) | ORAL | 12 refills | Status: AC
  Filled 2023-12-31: qty 60, 30d supply, fill #0
  Filled 2024-02-04: qty 60, 30d supply, fill #1
  Filled 2024-03-05: qty 60, 30d supply, fill #2

## 2023-12-31 NOTE — Progress Notes (Unsigned)
  Pt was originally scheduled for annual gyn exam. Pt does not want pap smear today because she is on her period and not comfortable w/ pap smear.  She will reschedule annual gyn exam.  Donna Bradshaw  Subjective:     Donna Bradshaw is a 38 y.o. female G0 not currently sexually active but sexually active in the past. She plans to use condoms when she becomes sexually active again.   Periods are monthly and regular lasting ~6 days and heavy with cramping intermittently. Pt states she has a Hx of being diagnosed with uterine fibroid, endometriosis, and had myomectomy 2018. Last US  was a few years ago.  Pt states she was diagnosed with Genital Herpes ~ March 2025. She had not been sexually active in the few weeks before what they thought was an original 1st outbreak. Her partner that she was with did not have a Hx of HSV (was negative). She had a prior partner who tested positive but it had been several months since she was active with him. Pt states her lesions seem to come every single month right before her period and is always several lesions. States she also gets yeast infection at the same time.    The following portions of the patient's history were reviewed and updated as appropriate: allergies, current medications, past family history, past medical history, past social history, past surgical history, and problem list.   Review of Systems Pertinent items are noted in HPI.    Objective:    BP 136/89   Pulse 85   Ht 5' 4 (1.626 m)   Wt 206 lb (93.4 kg)   LMP 12/27/2023   SpO2 100%   BMI 35.36 kg/m  General appearance: alert, cooperative, and appears stated age Pt declines physical exam/pap smear today   1. Vaginal lesion (Primary) - Pt thinks lesion was swabbed and was positive for HSV, but not available in Care Everywhere - Pt reports that lesions occur every month right before her period/menstrual bleeding starts in combination with yeast infection consistently - HSV 1 and 2  Ab, IgG  2. Fibroids - Will plan follow-up US  to evaluate  3. Menorrhagia with regular cycle - Pt declines any hormone therapy at this time  4. Herpes - HSV1,2 IgG pending  5. Vaginal yeast infection - May take Diflucan  150mg  po q48hrs before start of menstrual period  RTO for annual gyn exam/pap smear. Donna Bradshaw

## 2024-01-01 ENCOUNTER — Ambulatory Visit (HOSPITAL_BASED_OUTPATIENT_CLINIC_OR_DEPARTMENT_OTHER): Payer: Self-pay | Admitting: Certified Nurse Midwife

## 2024-01-01 LAB — HSV 1 AND 2 AB, IGG
HSV 1 Glycoprotein G Ab, IgG: REACTIVE — AB
HSV 2 IgG, Type Spec: REACTIVE — AB

## 2024-01-15 ENCOUNTER — Other Ambulatory Visit (HOSPITAL_COMMUNITY): Payer: Self-pay

## 2024-01-15 ENCOUNTER — Other Ambulatory Visit: Payer: Self-pay | Admitting: Nurse Practitioner

## 2024-01-15 ENCOUNTER — Other Ambulatory Visit: Payer: Self-pay

## 2024-01-15 MED ORDER — CYCLOBENZAPRINE HCL 10 MG PO TABS
10.0000 mg | ORAL_TABLET | Freq: Every day | ORAL | 3 refills | Status: AC
  Filled 2024-01-15: qty 90, 90d supply, fill #0

## 2024-01-15 NOTE — Telephone Encounter (Signed)
 Please advise [facility]

## 2024-01-28 ENCOUNTER — Other Ambulatory Visit (HOSPITAL_COMMUNITY): Payer: Self-pay

## 2024-02-04 ENCOUNTER — Other Ambulatory Visit (HOSPITAL_COMMUNITY): Payer: Self-pay

## 2024-02-10 ENCOUNTER — Other Ambulatory Visit (HOSPITAL_COMMUNITY): Payer: Self-pay

## 2024-02-10 ENCOUNTER — Ambulatory Visit (INDEPENDENT_AMBULATORY_CARE_PROVIDER_SITE_OTHER): Admitting: Certified Nurse Midwife

## 2024-02-10 ENCOUNTER — Encounter (HOSPITAL_BASED_OUTPATIENT_CLINIC_OR_DEPARTMENT_OTHER): Payer: Self-pay | Admitting: Certified Nurse Midwife

## 2024-02-10 ENCOUNTER — Other Ambulatory Visit (HOSPITAL_COMMUNITY)
Admission: RE | Admit: 2024-02-10 | Discharge: 2024-02-10 | Disposition: A | Discharge status: Home / Self Care | Source: Ambulatory Visit | Attending: Certified Nurse Midwife | Admitting: Certified Nurse Midwife

## 2024-02-10 VITALS — BP 133/78 | HR 91 | Ht 61.0 in | Wt 206.6 lb

## 2024-02-10 DIAGNOSIS — Z124 Encounter for screening for malignant neoplasm of cervix: Secondary | ICD-10-CM | POA: Diagnosis not present

## 2024-02-10 DIAGNOSIS — Z01419 Encounter for gynecological examination (general) (routine) without abnormal findings: Secondary | ICD-10-CM

## 2024-02-10 DIAGNOSIS — Z1151 Encounter for screening for human papillomavirus (HPV): Secondary | ICD-10-CM | POA: Diagnosis not present

## 2024-02-10 DIAGNOSIS — Z23 Encounter for immunization: Secondary | ICD-10-CM | POA: Diagnosis not present

## 2024-02-10 DIAGNOSIS — Z1231 Encounter for screening mammogram for malignant neoplasm of breast: Secondary | ICD-10-CM

## 2024-02-10 MED ORDER — ACYCLOVIR 5 % EX OINT
1.0000 | TOPICAL_OINTMENT | CUTANEOUS | 1 refills | Status: AC
  Filled 2024-02-10: qty 30, 4d supply, fill #0
  Filled 2024-02-11 (×2): qty 15, 2d supply, fill #0

## 2024-02-10 MED ORDER — ACYCLOVIR 5 % EX CREA
1.0000 | TOPICAL_CREAM | CUTANEOUS | 2 refills | Status: AC
  Filled 2024-02-10: qty 15, 2d supply, fill #0

## 2024-02-10 MED ORDER — NYSTATIN-TRIAMCINOLONE 100000-0.1 UNIT/GM-% EX OINT
1.0000 | TOPICAL_OINTMENT | Freq: Two times a day (BID) | CUTANEOUS | 30 refills | Status: AC
  Filled 2024-02-10: qty 60, 30d supply, fill #0

## 2024-02-10 NOTE — Progress Notes (Signed)
 38 y.o. G0P0000 Single Black or [place] American female here for annual exam.    Patient's last menstrual period was 01/22/2024.             Upstream - 02/10/24 1116       Pregnancy Intention Screening   Does the patient want to become pregnant in the next year? No    Does the patient's partner want to become pregnant in the next year? No    Would the patient like to discuss contraceptive options today? No         Exercising: Yes.     Smoker:  no  Health Maintenance: Pap:  Collected History of abnormal Pap:  No   reports that she has never smoked. She has never used smokeless tobacco. She reports current alcohol use. She reports that she does not use drugs.  Past Medical History:  Diagnosis Date   Asthma    Hypertension    Hyperthyroidism    no longer an issue   Migraine    Uterine fibroid     Past Surgical History:  Procedure Laterality Date   MYOMECTOMY N/A 03/05/2017   Procedure: MYOMECTOMY, ABDOMINAL;  Surgeon: Dannielle Bouchard, DO;  Location: [facility];  Service: Gynecology;  Laterality: N/A;   WISDOM TOOTH EXTRACTION      Current Outpatient Medications  Medication Sig Dispense Refill   acyclovir  (ZOVIRAX ) 400 MG tablet Take 1 tablet (400 mg total) by mouth 2 (two) times daily for suppression. 60 tablet 12   acyclovir  cream (ZOVIRAX ) 5 % Apply 1 Application topically every 3 (three) hours. May use for up to 7 days 15 g 2   acyclovir  ointment (ZOVIRAX ) 5 % Apply topically every 3 (three) hours. 30 g 1   amLODipine  (NORVASC ) 5 MG tablet Take 1 tablet (5 mg total) by mouth daily. 90 tablet 3   cyclobenzaprine  (FLEXERIL ) 10 MG tablet Take 1 tablet (10 mg total) by mouth at bedtime for headaches. 90 tablet 3   fluconazole  (DIFLUCAN ) 150 MG tablet Take 1 tablet (150 mg total) by mouth every other day as needed. 3 tablet 12   ibuprofen  (ADVIL ,MOTRIN ) 200 MG tablet Take 600-800 mg by mouth as needed (for cramps).     L-Lysine 1000 MG TABS Take 1,000 mg by mouth daily.      levothyroxine  (SYNTHROID ) 75 MCG tablet Take 1 tablet (75 mcg total) by mouth daily. 90 tablet 3   meclizine  (ANTIVERT ) 12.5 MG tablet Take 1 tablet (12.5 mg total) by mouth 3 (three) times daily as needed for dizziness. 30 tablet 0   Multiple Vitamin (MULTIVITAMIN) capsule Take by mouth.     mupirocin  ointment (BACTROBAN ) 2 % Apply 1 Application topically 2 (two) times daily. 22 g 0   naproxen sodium (ALEVE) 220 MG tablet Take 440 mg by mouth as needed.     naratriptan  (AMERGE) 2.5 MG tablet TAKE 1 TABLET BY MOUTH AT ONSET OF HEADACHE; IF RETURNS OR DOES NOT RESOLVE, MAY REPEAT AFTER 4 HOURS; DO NOT EXCEED 2 TABLETS IN 24 HOURS 9 tablet 0   nystatin -triamcinolone  ointment (MYCOLOG) Apply topically 2 (two) times daily. 60 g 30   triamcinolone  cream (KENALOG ) 0.5 % APPLY CREAM TOPICALLY TO AFFECTED AREA 4 TIMES DAILY 30 g 2   valACYclovir  (VALTREX ) 500 MG tablet Take 1 tablet (500 mg total) by mouth 2 (two) times daily. Take 2 tablets 3 times daily for 1 week then take 1 tablet twice daily for suppression 90 tablet 1   Vitamin D,  Cholecalciferol, 25 MCG (1000 UT) TABS Take by mouth. 2,000 units daily     No current facility-administered medications for this visit.    Family History  Problem Relation Age of Onset   Diabetes Maternal Uncle    Diabetes Maternal Grandmother    Breast cancer Paternal Grandmother    Cancer Other    Seizures Other    Migraines Neg Hx    Colon cancer Neg Hx    Stomach cancer Neg Hx    Pancreatic cancer Neg Hx    Esophageal cancer Neg Hx    Rectal cancer Neg Hx       Exam:   BP 133/78   Pulse 91   Ht 5' 1 (1.549 m) Comment: Reported  Wt 206 lb 9.6 oz (93.7 kg)   LMP 01/22/2024   BMI 39.04 kg/m   Height: 5' 1 (154.9 cm) (Reported)  General appearance: alert, cooperative and appears stated age Head: Normocephalic, without obvious abnormality, atraumatic Breasts: normal appearance, no masses or tenderness, Inspection negative, No nipple retraction or  dimpling, No nipple discharge or bleeding, No axillary or supraclavicular adenopathy, Normal to palpation without dominant masses Abdomen: soft, non-tender; bowel sounds normal; no masses,  no organomegaly Extremities: extremities normal, atraumatic, no cyanosis or edema Skin: Skin color, texture, turgor normal. No rashes or lesions No abnormal inguinal nodes palpated Neurologic: Grossly normal   Pelvic: External genitalia:  no lesions              Urethra:  normal appearing urethra with no masses, tenderness or lesions              Bartholins and Skenes: normal                 Vagina: normal appearing vagina with normal color and no discharge, no lesions              Cervix: no cervical motion tenderness, no lesions, and nulliparous appearance              Pap taken: Yes.   Bimanual Exam:  Uterus:  normal size, contour, position, consistency, mobility, non-tender              Adnexa: no mass, fullness, tenderness              Anus:  normal sphincter tone, no lesions  Chaperone,  CMA, was present for exam.  Assessment/Plan:   1. Encounter for annual routine gynecological examination (Primary) - Self breast awareness encouraged - Start annual screening mammograms  2. Encounter for screening mammogram for malignant neoplasm of breast - MM 3D SCREENING MAMMOGRAM BILATERAL BREAST; Future  3. Cervical cancer screening - Cytology - PAP( [facility])  4. Need for influenza vaccination - Flu vaccine trivalent PF, 6mos and older(Flulaval,Afluria,Fluarix,Fluzone)   RTO 1 year for annual gyn exam and prn if issues arise. Arland MARLA Roller

## 2024-02-11 ENCOUNTER — Other Ambulatory Visit (HOSPITAL_COMMUNITY): Payer: Self-pay

## 2024-02-13 LAB — CYTOLOGY - PAP
Comment: NEGATIVE
Comment: NEGATIVE
Comment: NEGATIVE
Diagnosis: NEGATIVE
HPV 16: NEGATIVE
HPV 18 / 45: POSITIVE — AB
High risk HPV: POSITIVE — AB

## 2024-02-17 ENCOUNTER — Ambulatory Visit (HOSPITAL_BASED_OUTPATIENT_CLINIC_OR_DEPARTMENT_OTHER): Payer: Self-pay | Admitting: Certified Nurse Midwife

## 2024-02-17 DIAGNOSIS — B977 Papillomavirus as the cause of diseases classified elsewhere: Secondary | ICD-10-CM | POA: Insufficient documentation

## 2024-02-26 ENCOUNTER — Encounter (HOSPITAL_BASED_OUTPATIENT_CLINIC_OR_DEPARTMENT_OTHER): Payer: Self-pay | Admitting: Obstetrics & Gynecology

## 2024-02-26 ENCOUNTER — Other Ambulatory Visit (HOSPITAL_COMMUNITY)
Admission: RE | Admit: 2024-02-26 | Discharge: 2024-02-26 | Disposition: A | Discharge status: Home / Self Care | Source: Ambulatory Visit | Attending: Obstetrics & Gynecology | Admitting: Obstetrics & Gynecology

## 2024-02-26 ENCOUNTER — Ambulatory Visit (HOSPITAL_BASED_OUTPATIENT_CLINIC_OR_DEPARTMENT_OTHER): Admitting: Obstetrics & Gynecology

## 2024-02-26 VITALS — BP 136/92 | HR 88 | Ht 64.0 in | Wt 205.4 lb

## 2024-02-26 DIAGNOSIS — N870 Mild cervical dysplasia: Secondary | ICD-10-CM | POA: Diagnosis not present

## 2024-02-26 DIAGNOSIS — B977 Papillomavirus as the cause of diseases classified elsewhere: Secondary | ICD-10-CM | POA: Insufficient documentation

## 2024-02-26 DIAGNOSIS — R87810 Cervical high risk human papillomavirus (HPV) DNA test positive: Secondary | ICD-10-CM | POA: Diagnosis not present

## 2024-02-26 NOTE — Progress Notes (Signed)
 38 y.o. G0P0000 Single African-American female here for colposcopy with possible biopsies and/or ECC due to her screening for HPV being positive for HPV #18. Pap obtained 02/10/2024.    Patient's last menstrual period was 02/17/2024 (exact date).          Sexually active: Yes.    The current method of family planning is none.     Patient has been counseled about results and procedure.  Risks and benefits have bene reviewed including immediate and/or delayed bleeding, infection, cervical scaring from procedure, possibility of needing additional follow up as well as treatment.  Rare risks of missing a lesion discussed as well.  All questions answered.  Pt ready to proceed.  Consent obtained.  BP (!) 136/92 (BP Location: Left Arm, Patient Position: Sitting, Cuff Size: Normal)   Pulse 88   Ht 5' 4 (1.626 m)   Wt 205 lb 6.4 oz (93.2 kg)   LMP 02/17/2024 (Exact Date)   SpO2 100%   BMI 35.26 kg/m   General appearance: alert, cooperative and appears stated age Lymph nodes: No abnormal inguinal nodes palpated Neurologic: Grossly normal  Pelvic: External genitalia:  no lesions              Urethra:  normal appearing urethra with no masses, tenderness or lesions              Bartholins and Skenes: normal                 Vagina: normal appearing vagina with normal color and no discharge, no lesions               Physical Exam Genitourinary:       Speculum placed.  3% acetic acid applied to cervix for >45 seconds.  Cervix visualized with both 7.5X and 15X magnification.  Green filter also used.  Lugols solution was not used.  Findings:  AWE noted 1-2 o'clock.  Biopsy:  1 o'clock.  ECC:  was performed.  Monsel's was needed.  Excellent hemostasis was present.  Pt tolerated procedure well and all instruments were removed.  Findings noted above on picture of cervix.  Chaperone was present during procedure.  Assessment/Plan: 1. 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

## 2024-02-28 LAB — SURGICAL PATHOLOGY

## 2024-02-29 ENCOUNTER — Ambulatory Visit (HOSPITAL_BASED_OUTPATIENT_CLINIC_OR_DEPARTMENT_OTHER): Payer: Self-pay | Admitting: Obstetrics & Gynecology

## 2024-03-05 ENCOUNTER — Other Ambulatory Visit: Payer: Self-pay | Admitting: Nurse Practitioner

## 2024-03-05 NOTE — Telephone Encounter (Signed)
 Copied from CRM [id number]. Topic: Clinical - Medication Refill >> Mar 05, 2024  3:51 PM Charlet HERO wrote: Medication: naratriptan  (AMERGE) 2.5 MG tablet  Has the patient contacted their pharmacy? Yes 0 refills  This is the patient's preferred pharmacy:  [facility] [address], [place] - [address] [place] [address] Phone: [phone] Fax: [phone]  Is this the correct pharmacy for this prescription? Yes If no, delete pharmacy and type the correct one.   Has the prescription been filled recently? Yes  Is the patient out of the medication? Yes  Has the patient been seen for an appointment in the last year OR does the patient have an upcoming appointment? Yes  Can we respond through MyChart? Yes  Agent: Please be advised that Rx refills may take up to 3 business days. We ask that you follow-up with your pharmacy.

## 2024-03-06 NOTE — Telephone Encounter (Signed)
 Please advise [facility]

## 2024-03-09 ENCOUNTER — Encounter (HOSPITAL_BASED_OUTPATIENT_CLINIC_OR_DEPARTMENT_OTHER): Payer: Self-pay | Admitting: Radiology

## 2024-03-09 ENCOUNTER — Ambulatory Visit (HOSPITAL_BASED_OUTPATIENT_CLINIC_OR_DEPARTMENT_OTHER)
Admission: RE | Admit: 2024-03-09 | Discharge: 2024-03-09 | Disposition: A | Discharge status: Home / Self Care | Source: Ambulatory Visit | Attending: Certified Nurse Midwife | Admitting: Certified Nurse Midwife

## 2024-03-09 DIAGNOSIS — Z1231 Encounter for screening mammogram for malignant neoplasm of breast: Secondary | ICD-10-CM | POA: Insufficient documentation

## 2024-03-09 MED ORDER — NARATRIPTAN HCL 2.5 MG PO TABS: ORAL_TABLET | ORAL | 0 refills | Status: DC

## 2024-03-24 ENCOUNTER — Other Ambulatory Visit: Payer: Self-pay | Admitting: Nurse Practitioner

## 2024-03-24 ENCOUNTER — Other Ambulatory Visit: Payer: Self-pay

## 2024-03-24 DIAGNOSIS — A6000 Herpesviral infection of urogenital system, unspecified: Secondary | ICD-10-CM

## 2024-03-24 NOTE — Telephone Encounter (Signed)
 Please advise [facility]

## 2024-03-25 ENCOUNTER — Other Ambulatory Visit: Payer: Self-pay

## 2024-03-25 ENCOUNTER — Other Ambulatory Visit: Payer: Self-pay | Admitting: Certified Nurse Midwife

## 2024-03-25 DIAGNOSIS — R928 Other abnormal and inconclusive findings on diagnostic imaging of breast: Secondary | ICD-10-CM

## 2024-03-25 MED ORDER — VALACYCLOVIR HCL 500 MG PO TABS
500.0000 mg | ORAL_TABLET | Freq: Two times a day (BID) | ORAL | 1 refills | Status: AC
  Filled 2024-03-25: qty 90, 45d supply, fill #0
  Filled 2024-05-13 – 2024-05-27 (×2): qty 90, 45d supply, fill #1

## 2024-04-07 ENCOUNTER — Ambulatory Visit
Admission: RE | Admit: 2024-04-07 | Discharge: 2024-04-07 | Disposition: A | Discharge status: Home / Self Care | Source: Ambulatory Visit | Attending: Certified Nurse Midwife | Admitting: Certified Nurse Midwife

## 2024-04-07 DIAGNOSIS — R928 Other abnormal and inconclusive findings on diagnostic imaging of breast: Secondary | ICD-10-CM

## 2024-04-07 DIAGNOSIS — N6310 Unspecified lump in the right breast, unspecified quadrant: Secondary | ICD-10-CM | POA: Diagnosis not present

## 2024-04-08 ENCOUNTER — Other Ambulatory Visit: Payer: Self-pay | Admitting: Certified Nurse Midwife

## 2024-04-08 DIAGNOSIS — R928 Other abnormal and inconclusive findings on diagnostic imaging of breast: Secondary | ICD-10-CM

## 2024-04-09 ENCOUNTER — Other Ambulatory Visit: Payer: Self-pay | Admitting: Nurse Practitioner

## 2024-04-09 DIAGNOSIS — E038 Other specified hypothyroidism: Secondary | ICD-10-CM

## 2024-04-09 DIAGNOSIS — I10 Essential (primary) hypertension: Secondary | ICD-10-CM

## 2024-04-10 ENCOUNTER — Other Ambulatory Visit: Payer: Self-pay

## 2024-04-10 ENCOUNTER — Telehealth: Payer: Self-pay

## 2024-04-10 NOTE — Telephone Encounter (Signed)
.  advised. KH

## 2024-04-10 NOTE — Telephone Encounter (Signed)
 naratriptan  (AMERGE) 2.5 MG tablet [[id number]]

## 2024-04-13 MED ORDER — NARATRIPTAN HCL 2.5 MG PO TABS: ORAL_TABLET | ORAL | 0 refills | Status: DC

## 2024-04-15 ENCOUNTER — Other Ambulatory Visit (HOSPITAL_BASED_OUTPATIENT_CLINIC_OR_DEPARTMENT_OTHER): Payer: Self-pay | Admitting: Certified Nurse Midwife

## 2024-04-15 ENCOUNTER — Encounter (HOSPITAL_BASED_OUTPATIENT_CLINIC_OR_DEPARTMENT_OTHER): Payer: Self-pay | Admitting: Certified Nurse Midwife

## 2024-04-15 DIAGNOSIS — N939 Abnormal uterine and vaginal bleeding, unspecified: Secondary | ICD-10-CM

## 2024-04-15 MED ORDER — NORETHINDRONE ACETATE 5 MG PO TABS: 5.0000 mg | ORAL_TABLET | Freq: Every day | ORAL | 1 refills | Status: AC

## 2024-04-16 ENCOUNTER — Ambulatory Visit (HOSPITAL_BASED_OUTPATIENT_CLINIC_OR_DEPARTMENT_OTHER): Payer: Self-pay | Admitting: Certified Nurse Midwife

## 2024-04-16 ENCOUNTER — Ambulatory Visit (HOSPITAL_BASED_OUTPATIENT_CLINIC_OR_DEPARTMENT_OTHER)
Admission: RE | Admit: 2024-04-16 | Discharge: 2024-04-16 | Disposition: A | Discharge status: Home / Self Care | Source: Ambulatory Visit | Attending: Certified Nurse Midwife | Admitting: Certified Nurse Midwife

## 2024-04-16 DIAGNOSIS — N83202 Unspecified ovarian cyst, left side: Secondary | ICD-10-CM | POA: Diagnosis not present

## 2024-04-16 DIAGNOSIS — N939 Abnormal uterine and vaginal bleeding, unspecified: Secondary | ICD-10-CM | POA: Diagnosis not present

## 2024-04-16 DIAGNOSIS — Z86018 Personal history of other benign neoplasm: Secondary | ICD-10-CM | POA: Diagnosis not present

## 2024-04-16 DIAGNOSIS — N9489 Other specified conditions associated with female genital organs and menstrual cycle: Secondary | ICD-10-CM | POA: Insufficient documentation

## 2024-04-16 NOTE — Progress Notes (Signed)
 Dr. Cleotilde,  Will you please review this Pelvic US  and tell me if you agree with ordering MRI to further evaluate? This was also noted on the 01/07/20 CT abdomen (they called it a complex left adnexal lesion measuring up to 5.8cm).  Kaitlyn, Please call patient and schedule her for endometrial biopsy due to the abnormal uterine bleeding.  Thank you.

## 2024-04-17 ENCOUNTER — Other Ambulatory Visit (HOSPITAL_BASED_OUTPATIENT_CLINIC_OR_DEPARTMENT_OTHER): Payer: Self-pay | Admitting: Obstetrics & Gynecology

## 2024-04-17 DIAGNOSIS — N9489 Other specified conditions associated with female genital organs and menstrual cycle: Secondary | ICD-10-CM

## 2024-04-21 ENCOUNTER — Ambulatory Visit (HOSPITAL_BASED_OUTPATIENT_CLINIC_OR_DEPARTMENT_OTHER): Admitting: Certified Nurse Midwife

## 2024-04-21 ENCOUNTER — Encounter (HOSPITAL_BASED_OUTPATIENT_CLINIC_OR_DEPARTMENT_OTHER): Payer: Self-pay | Admitting: Certified Nurse Midwife

## 2024-04-21 ENCOUNTER — Other Ambulatory Visit (HOSPITAL_COMMUNITY)
Admission: RE | Admit: 2024-04-21 | Discharge: 2024-04-21 | Disposition: A | Discharge status: Home / Self Care | Source: Ambulatory Visit | Attending: Certified Nurse Midwife | Admitting: Certified Nurse Midwife

## 2024-04-21 VITALS — BP 141/95 | HR 113 | Ht 64.0 in | Wt 205.0 lb

## 2024-04-21 DIAGNOSIS — N939 Abnormal uterine and vaginal bleeding, unspecified: Secondary | ICD-10-CM | POA: Insufficient documentation

## 2024-04-24 LAB — SURGICAL PATHOLOGY

## 2024-04-29 ENCOUNTER — Ambulatory Visit (HOSPITAL_BASED_OUTPATIENT_CLINIC_OR_DEPARTMENT_OTHER): Payer: Self-pay | Admitting: Certified Nurse Midwife

## 2024-04-29 NOTE — Progress Notes (Signed)
 Subjective:     Donna Bradshaw is a 38 y.o. female who presents for endometrial biopsy due to abnormal uterine bleeding.   The following portions of the patient's history were reviewed and updated as appropriate: allergies, current medications, past family history, past medical history, past social history, past surgical history, and problem list.   Review of Systems Pertinent items are noted in HPI.    Objective:    BP (!) 141/95   Pulse (!) 113   Ht 5' 4 (1.626 m)   Wt 205 lb (93 kg)   BMI 35.19 kg/m  General appearance: alert, cooperative, and appears stated age Pelvic: cervix normal in appearance, external genitalia normal, no adnexal masses or tenderness, no cervical motion tenderness, and uterus normal size, shape, and consistency    Assessment:    AUB.    Plan:   ENDOMETRIAL BIOPSY     The indications for endometrial biopsy were reviewed.   Risks of the biopsy including cramping, bleeding, infection, uterine perforation, inadequate specimen and need for additional procedures  were discussed. The patient states she understands and agrees to undergo procedure today. Consent was signed. Time out was performed. Urine HCG was negative. A sterile speculum was placed in the patient's vagina and the cervix was prepped with Betadine. A single-toothed tenaculum was placed on the anterior lip of the cervix to stabilize it. The 3 mm pipelle was introduced into the endometrial cavity without difficulty to a depth of 9 cm, and a moderate amount of tissue was obtained and sent to pathology. The instruments were removed from the patient's vagina. Minimal bleeding from the cervix was noted. The patient tolerated the procedure well. Routine post-procedure instructions were given to the patient. The patient will follow up to review the results and for further management.     Arland MARLA Roller

## 2024-05-08 ENCOUNTER — Other Ambulatory Visit: Payer: Self-pay

## 2024-05-08 ENCOUNTER — Ambulatory Visit
Admission: RE | Admit: 2024-05-08 | Discharge: 2024-05-08 | Disposition: A | Discharge status: Home / Self Care | Source: Ambulatory Visit | Attending: Family Medicine

## 2024-05-08 VITALS — BP 145/91 | HR 89 | Temp 98.3°F | Resp 17

## 2024-05-08 DIAGNOSIS — J329 Chronic sinusitis, unspecified: Secondary | ICD-10-CM

## 2024-05-08 DIAGNOSIS — J40 Bronchitis, not specified as acute or chronic: Secondary | ICD-10-CM

## 2024-05-08 MED ORDER — ALBUTEROL SULFATE HFA 108 (90 BASE) MCG/ACT IN AERS: 1.0000 | INHALATION_SPRAY | Freq: Four times a day (QID) | RESPIRATORY_TRACT | 0 refills | Status: AC | PRN

## 2024-05-08 MED ORDER — PROMETHAZINE-DM 6.25-15 MG/5ML PO SYRP: 5.0000 mL | ORAL_SOLUTION | Freq: Four times a day (QID) | ORAL | 0 refills | Status: AC | PRN

## 2024-05-08 MED ORDER — PREDNISONE 20 MG PO TABS: 40.0000 mg | ORAL_TABLET | Freq: Every day | ORAL | 0 refills | Status: DC

## 2024-05-08 MED ORDER — PROMETHAZINE-DM 6.25-15 MG/5ML PO SYRP: 5.0000 mL | ORAL_SOLUTION | Freq: Four times a day (QID) | ORAL | 0 refills | Status: DC | PRN

## 2024-05-08 MED ORDER — PREDNISONE 20 MG PO TABS: 40.0000 mg | ORAL_TABLET | Freq: Every day | ORAL | 0 refills | Status: AC

## 2024-05-08 MED ORDER — AMOXICILLIN-POT CLAVULANATE 875-125 MG PO TABS: 1.0000 | ORAL_TABLET | Freq: Two times a day (BID) | ORAL | 0 refills | Status: DC

## 2024-05-08 MED ORDER — AMOXICILLIN-POT CLAVULANATE 875-125 MG PO TABS: 1.0000 | ORAL_TABLET | Freq: Two times a day (BID) | ORAL | 0 refills | Status: AC

## 2024-05-08 MED ORDER — ALBUTEROL SULFATE HFA 108 (90 BASE) MCG/ACT IN AERS: 1.0000 | INHALATION_SPRAY | Freq: Four times a day (QID) | RESPIRATORY_TRACT | 0 refills | Status: DC | PRN

## 2024-05-08 NOTE — ED Triage Notes (Signed)
 Pt c/o sore throat, ear pain  bilat, productive cough w/yellow/green mucous, chest congestion, [id number]

## 2024-05-08 NOTE — ED Provider Notes (Addendum)
 [facility]    CSN: [id number] Arrival date & time: 05/08/24  1458      History   Chief Complaint Chief Complaint  Patient presents with   Cough    Coughing, chest congestion and tightness, runny and stuffy nose for over a week - Entered by patient    HPI Donna Bradshaw is a 38 y.o. female  presents for evaluation of URI symptoms for 7 days. Patient reports associated symptoms of cough, congestion, ear pain, sinus pressure/pain, shortness of breath, sore throat. Denies N/V/D, body aches. Patient does have a history of asthma but outgrew it.  She states occasionally she will have asthma symptoms with respiratory illnesses.  Does not currently have an inhaler.    Reports no known sick contacts.  Pt has taken medicine OTC for symptoms.  Denies pregnancy or breast-feeding.  Pt has no other concerns at this time.    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   Menorrhagia with regular cycle 12/31/2023   Fibroids 12/31/2023   Vaginal lesion 12/31/2023   Vaginal yeast infection 12/31/2023   Herpes 12/31/2023   Vaginal irritation 08/08/2023   Abdominal bloating 02/02/2022   Chronic idiopathic constipation 02/02/2022   Left lower quadrant pain 02/02/2022   Obesity 02/02/2022   Abnormal TSH 09/14/2019   Vitamin D insufficiency 09/14/2019   Migraine without aura and without status migrainosus, not intractable 05/08/2017   Benign essential HTN 05/08/2017   S/P myomectomy 03/05/2017   Uterine leiomyoma 12/19/2015   Migraine 12/19/2015   Migraine without aura 06/14/2015   Hyperthyroidism 07/21/2014    Past Surgical History:  Procedure Laterality Date   MYOMECTOMY N/A  03/05/2017   Procedure: MYOMECTOMY, ABDOMINAL;  Surgeon: Dannielle Bouchard, DO;  Location: [facility];  Service: Gynecology;  Laterality: N/A;   WISDOM TOOTH EXTRACTION      OB History     Gravida  0   Para  0   Term  0   Preterm  0   AB  0   Living  0      SAB  0   IAB  0   Ectopic  0   Multiple  0   Live Births  0            Home Medications    Prior to Admission medications  Medication Sig Start Date End Date Taking? Authorizing Provider  albuterol (VENTOLIN HFA) 108 (90 Base) MCG/ACT inhaler Inhale 1-2 puffs into the lungs every 6 (six) hours as needed. 05/08/24  Yes Zaelyn Noack, Jodi R, NP  amoxicillin -clavulanate (AUGMENTIN ) 875-125 MG tablet Take 1 tablet by mouth every 12 (twelve) hours. 05/08/24  Yes Zeth Buday, Jodi R, NP  predniSONE (DELTASONE) 20 MG tablet Take 2 tablets (40 mg total) by mouth daily with breakfast for 5 days. 05/08/24 05/13/24 Yes Chai Verdejo, Jodi R, NP  promethazine -dextromethorphan (PROMETHAZINE -DM) 6.25-15 MG/5ML syrup Take 5 mLs by mouth 4 (four) times daily as needed for cough. 05/08/24  Yes Willies Laviolette, Jodi R, NP  acyclovir  (ZOVIRAX ) 400 MG tablet Take 1 tablet (400 mg total) by mouth 2 (two) times daily for suppression. 12/31/23  Lo, Donna K, CNM  acyclovir  cream (ZOVIRAX ) 5 % Apply 1 Application topically every 3 (three) hours. May use for up to 7 days 02/10/24   Lo, Donna K, CNM  acyclovir  ointment (ZOVIRAX ) 5 % Apply topically every 3 (three) hours. 02/10/24   Tad Arland POUR, CNM  amLODipine  (NORVASC ) 5 MG tablet Take 1 tablet by mouth once daily 04/10/24   Nichols, Tonya S, NP  cyclobenzaprine  (FLEXERIL ) 10 MG tablet Take 1 tablet (10 mg total) by mouth at bedtime for headaches. 01/15/24   Oley Bascom RAMAN, NP  ibuprofen  (ADVIL ,MOTRIN ) 200 MG tablet Take 600-800 mg by mouth as needed (for cramps).    [provider]  L-Lysine 1000 MG TABS Take 1,000 mg by mouth daily.    [provider]  levothyroxine  (SYNTHROID ) 75 MCG tablet Take 1 tablet by  mouth once daily 04/10/24   Nichols, Tonya S, NP  Multiple Vitamin (MULTIVITAMIN) capsule Take by mouth.    [provider]  naproxen sodium (ALEVE) 220 MG tablet Take 440 mg by mouth as needed.    [provider]  naratriptan  (AMERGE) 2.5 MG tablet Take 1 tablet by mouth at onset of headache if symptoms return or do not resolve may repeat after 4 hours, do not exceed 2 tablets in 24 hours 04/13/24   Oley Bascom RAMAN, NP  norethindrone  (AYGESTIN ) 5 MG tablet Take 1 tablet (5 mg total) by mouth daily. Take it three times daily (every 8 hours) until the bleeding stops then decrease to once daily 04/15/24   Lo, Donna K, CNM  nystatin -triamcinolone  ointment (MYCOLOG) Apply topically 2 (two) times daily. 02/10/24   Tad Arland POUR, CNM  triamcinolone  cream (KENALOG ) 0.5 % APPLY CREAM TOPICALLY TO AFFECTED AREA 4 TIMES DAILY 03/13/23   Nichols, Tonya S, NP  valACYclovir  (VALTREX ) 500 MG tablet Take 1 tablet (500 mg total) by mouth 2 (two) times daily. Take 2 tablets 3 times daily for 1 week then take 1 tablet twice daily for suppression 03/25/24   Nichols, Tonya S, NP  Vitamin D, Cholecalciferol, 25 MCG (1000 UT) TABS Take by mouth. 2,000 units daily    [provider]    Family History Family History  Problem Relation Age of Onset   Diabetes Maternal Uncle    Diabetes Maternal Grandmother    Breast cancer Paternal Grandmother    Cancer Other    Seizures Other    Migraines Neg Hx    Colon cancer Neg Hx    Stomach cancer Neg Hx    Pancreatic cancer Neg Hx    Esophageal cancer Neg Hx    Rectal cancer Neg Hx     Social History Social History[1]   Allergies   Patient has no known allergies.   Review of Systems Review of Systems  HENT:  Positive for congestion and sore throat.   Respiratory:  Positive for cough and shortness of breath.      Physical Exam Triage Vital Signs ED Triage Vitals  Encounter Vitals Group     BP 05/08/24 1545 (!) 145/91     Girls  Systolic BP Percentile --      Girls Diastolic BP Percentile --      Boys Systolic BP Percentile --      Boys Diastolic BP Percentile --      Pulse Rate 05/08/24 1545 89     Resp 05/08/24 1545 17     Temp 05/08/24 1545 98.3 F (36.8 C)     Temp  Source 05/08/24 1545 Oral     SpO2 05/08/24 1545 98 %     Weight --      Height --      Head Circumference --      Peak Flow --      Pain Score 05/08/24 1542 0     Pain Loc --      Pain Education --      Exclude from Growth Chart --    No data found.  Updated Vital Signs BP (!) 145/91   Pulse 89   Temp 98.3 F (36.8 C) (Oral)   Resp 17   LMP 04/29/2024   SpO2 98%   Visual Acuity Right Eye Distance:   Left Eye Distance:   Bilateral Distance:    Right Eye Near:   Left Eye Near:    Bilateral Near:     Physical Exam Vitals and nursing note reviewed.  Constitutional:      General: She is not in acute distress.    Appearance: She is well-developed. She is not ill-appearing.  HENT:     Head: Normocephalic and atraumatic.     Right Ear: Tympanic membrane and ear canal normal.     Left Ear: Tympanic membrane and ear canal normal.     Nose: Congestion present.     Right Turbinates: Swollen and pale.     Left Turbinates: Swollen and pale.     Right Sinus: Maxillary sinus tenderness present. No frontal sinus tenderness.     Left Sinus: Maxillary sinus tenderness present. No frontal sinus tenderness.     Mouth/Throat:     Mouth: Mucous membranes are moist.     Pharynx: Oropharynx is clear. Uvula midline. No oropharyngeal exudate or posterior oropharyngeal erythema.     Tonsils: No tonsillar exudate or tonsillar abscesses.  Eyes:     Conjunctiva/sclera: Conjunctivae normal.     Pupils: Pupils are equal, round, and reactive to light.  Cardiovascular:     Rate and Rhythm: Normal rate and regular rhythm.     Heart sounds: Normal heart sounds.  Pulmonary:     Effort: Pulmonary effort is normal.     Breath sounds: Normal breath  sounds. No wheezing, rhonchi or rales.  Musculoskeletal:     Cervical back: Normal range of motion and neck supple.  Lymphadenopathy:     Cervical: No cervical adenopathy.  Skin:    General: Skin is warm and dry.  Neurological:     General: No focal deficit present.     Mental Status: She is alert and oriented to person, place, and time.  Psychiatric:        Mood and Affect: Mood normal.        Behavior: Behavior normal.      UC Treatments / Results  Labs (all labs ordered are listed, but only abnormal results are displayed) Labs Reviewed - No data to display  EKG   Radiology No results found.  Procedures Procedures (including critical care time)  Medications Ordered in UC Medications - No data to display  Initial Impression / Assessment and Plan / UC Course  I have reviewed the triage vital signs and the nursing notes.  Pertinent labs & imaging results that were available during my care of the patient were reviewed by me and considered in my medical decision making (see chart for details).     Reviewed exam and symptoms with patient.  No red flags.  Will treat for sinusitis/bronchitis with Augmentin .  Promethazine  DM  as needed for cough.  Refilled albuterol inhaler and will do prednisone for 5 days for asthma symptoms.  Encourage rest fluids and PCP follow-up 2 to 3 days for recheck.  ER precautions reviewed. Final Clinical Impressions(s) / UC Diagnoses   Final diagnoses:  Sinobronchitis     Discharge Instructions      Start Augmentin  antibiotic twice daily for 7 days.  May take Promethazine  DM as needed for your cough.  Please have this medication make you drowsy.  Do not drink alcohol or drive on this medication.  Albuterol inhaler as needed for wheezing or shortness of breath.  Prednisone daily for 5 days to help with your asthma symptoms.  Lots of rest and fluids and follow-up with your PCP in 2 to 3 days for recheck.  I hope you feel better soon!    ED  Prescriptions     Medication Sig Dispense Auth. Provider   amoxicillin -clavulanate (AUGMENTIN ) 875-125 MG tablet Take 1 tablet by mouth every 12 (twelve) hours. 14 tablet Gualberto Wahlen, Jodi R, NP   albuterol (VENTOLIN HFA) 108 (90 Base) MCG/ACT inhaler Inhale 1-2 puffs into the lungs every 6 (six) hours as needed. 1 each Madeleine Fenn, Jodi R, NP   promethazine -dextromethorphan (PROMETHAZINE -DM) 6.25-15 MG/5ML syrup Take 5 mLs by mouth 4 (four) times daily as needed for cough. 118 mL Celicia Minahan, Jodi R, NP   predniSONE (DELTASONE) 20 MG tablet Take 2 tablets (40 mg total) by mouth daily with breakfast for 5 days. 10 tablet Sherl Yzaguirre, Jodi R, NP      PDMP not reviewed this encounter.    Loreda Myla SAUNDERS, NP 05/08/24 1558     [1]  Social History Tobacco Use   Smoking status: Never   Smokeless tobacco: Never  Vaping Use   Vaping status: Never Used  Substance Use Topics   Alcohol use: Yes    Alcohol/week: 0.0 standard drinks of alcohol    Comment: occ   Drug use: No     Loreda Myla SAUNDERS, NP 05/08/24 1558

## 2024-05-08 NOTE — Discharge Instructions (Signed)
 Start Augmentin  antibiotic twice daily for 7 days.  May take Promethazine  DM as needed for your cough.  Please have this medication make you drowsy.  Do not drink alcohol or drive on this medication.  Albuterol inhaler as needed for wheezing or shortness of breath.  Prednisone daily for 5 days to help with your asthma symptoms.  Lots of rest and fluids and follow-up with your PCP in 2 to 3 days for recheck.  I hope you feel better soon!

## 2024-05-13 ENCOUNTER — Other Ambulatory Visit: Payer: Self-pay | Admitting: Nurse Practitioner

## 2024-05-13 DIAGNOSIS — E038 Other specified hypothyroidism: Secondary | ICD-10-CM

## 2024-05-13 DIAGNOSIS — I10 Essential (primary) hypertension: Secondary | ICD-10-CM

## 2024-05-15 ENCOUNTER — Ambulatory Visit (HOSPITAL_BASED_OUTPATIENT_CLINIC_OR_DEPARTMENT_OTHER)
Admission: RE | Admit: 2024-05-15 | Discharge: 2024-05-15 | Disposition: A | Discharge status: Home / Self Care | Source: Ambulatory Visit | Attending: Obstetrics & Gynecology | Admitting: Obstetrics & Gynecology

## 2024-05-15 DIAGNOSIS — D251 Intramural leiomyoma of uterus: Secondary | ICD-10-CM | POA: Diagnosis not present

## 2024-05-15 DIAGNOSIS — N9489 Other specified conditions associated with female genital organs and menstrual cycle: Secondary | ICD-10-CM | POA: Insufficient documentation

## 2024-05-15 MED ORDER — GADOBUTROL 1 MMOL/ML IV SOLN
9.0000 mL | Freq: Once | INTRAVENOUS | Status: AC | PRN
  Administered 2024-05-15: 9 mL via INTRAVENOUS
  Filled 2024-05-15: qty 10

## 2024-05-18 ENCOUNTER — Telehealth: Payer: Self-pay

## 2024-05-18 NOTE — Telephone Encounter (Signed)
 naratriptan  (AMERGE) 2.5 MG tablet [[id number]]

## 2024-05-19 ENCOUNTER — Other Ambulatory Visit: Payer: Self-pay

## 2024-05-19 ENCOUNTER — Ambulatory Visit (HOSPITAL_BASED_OUTPATIENT_CLINIC_OR_DEPARTMENT_OTHER): Payer: Self-pay | Admitting: Obstetrics & Gynecology

## 2024-05-19 MED ORDER — NARATRIPTAN HCL 2.5 MG PO TABS: ORAL_TABLET | ORAL | 0 refills | Status: DC

## 2024-05-19 NOTE — Telephone Encounter (Signed)
 Sent to [facility] due to Tonya being OTO. KH

## 2024-05-19 NOTE — Telephone Encounter (Signed)
Please advise in Tonya absence. Thanks KH 

## 2024-05-22 ENCOUNTER — Other Ambulatory Visit (HOSPITAL_COMMUNITY): Payer: Self-pay

## 2024-05-27 ENCOUNTER — Other Ambulatory Visit (HOSPITAL_BASED_OUTPATIENT_CLINIC_OR_DEPARTMENT_OTHER): Payer: Self-pay | Admitting: Certified Nurse Midwife

## 2024-05-27 ENCOUNTER — Other Ambulatory Visit (HOSPITAL_COMMUNITY): Payer: Self-pay

## 2024-05-27 NOTE — Telephone Encounter (Signed)
 naratriptan  (AMERGE) 2.5 MG tablet [[id number]]

## 2024-05-29 MED ORDER — NARATRIPTAN HCL 2.5 MG PO TABS: ORAL_TABLET | ORAL | 0 refills | Status: DC

## 2024-06-12 ENCOUNTER — Ambulatory Visit: Payer: Self-pay | Admitting: Nurse Practitioner

## 2024-06-12 ENCOUNTER — Encounter: Payer: Self-pay | Admitting: Nurse Practitioner

## 2024-06-12 VITALS — BP 145/89 | HR 88 | Temp 98.1°F | Wt 197.0 lb

## 2024-06-12 DIAGNOSIS — Z1329 Encounter for screening for other suspected endocrine disorder: Secondary | ICD-10-CM | POA: Diagnosis not present

## 2024-06-12 DIAGNOSIS — I10 Essential (primary) hypertension: Secondary | ICD-10-CM | POA: Diagnosis not present

## 2024-06-12 DIAGNOSIS — Z13220 Encounter for screening for lipoid disorders: Secondary | ICD-10-CM

## 2024-06-12 DIAGNOSIS — A6000 Herpesviral infection of urogenital system, unspecified: Secondary | ICD-10-CM | POA: Diagnosis not present

## 2024-06-12 DIAGNOSIS — E038 Other specified hypothyroidism: Secondary | ICD-10-CM | POA: Diagnosis not present

## 2024-06-12 MED ORDER — LEVOTHYROXINE SODIUM 75 MCG PO TABS: 75.0000 ug | ORAL_TABLET | Freq: Every day | ORAL | 0 refills | Status: AC

## 2024-06-12 MED ORDER — AMLODIPINE BESYLATE 5 MG PO TABS: 5.0000 mg | ORAL_TABLET | Freq: Every day | ORAL | 0 refills | Status: AC

## 2024-06-12 MED ORDER — NARATRIPTAN HCL 2.5 MG PO TABS: ORAL_TABLET | ORAL | 0 refills | Status: AC

## 2024-06-12 NOTE — Progress Notes (Signed)
 "  Subjective   Patient ID: Donna Bradshaw, female    DOB: [birth year], 39 y.o.   MRN: [id number]  Chief Complaint  Patient presents with   Hypertension    6 month. Would like TSH checked.     Referring provider: Oley Bascom RAMAN, NP  Donna Bradshaw is a 39 y.o. female with Past Medical History: No date: Asthma No date: Hypertension No date: Hyperthyroidism     Comment:  no longer an issue No date: Migraine No date: Uterine fibroid   HPI   Hypertension:    Patient here for follow-up of elevated blood pressure. She is exercising and is adherent to low salt diet.  Denies checking B/P at home. Cardiac symptoms none. Patient denies none. Cardiovascular risk factors: hypertension and obesity (BMI >= 30 kg/m2). Use of agents associated with hypertension: none. History of target organ damage: none. Denies any other concerns today. Questions whether she needed to have her B/P medication adjusted.     Hypothyroidism:    Continues on synthroid . Will recheck thyroid  panel today. No new issues or concerns.     Denies f/c/s, n/v/d, hemoptysis, PND, leg swelling Denies chest pain or edema    Allergies[1]  Immunization History  Administered Date(s) Administered   Influenza, Quadrivalent, Recombinant, Inj, Pf 03/02/2018   Influenza, Seasonal, Injecte, Preservative Fre 02/10/2024   Influenza,inj,Quad PF,6+ Mos 02/17/2017, 01/29/2019   Influenza,inj,quad, With Preservative 02/26/2015   Influenza-Unspecified 02/26/2015, 02/17/2017, 02/25/2018, 02/23/2020   PFIZER(Purple Top)SARS-COV-2 Vaccination 07/17/2019, 08/07/2019   Td 11/05/2017   Td (Adult),5 Lf Tetanus Toxid, Preservative Free 11/05/2017   Tdap 12/31/2017   Typhoid Inactivated 01/03/2023   Yellow Fever 01/03/2023    Tobacco History: Tobacco Use History[2] Counseling given: Not Answered   Outpatient Encounter Medications as of 06/12/2024  Medication Sig   acyclovir  (ZOVIRAX ) 400 MG tablet Take 1 tablet (400 mg total)  by mouth 2 (two) times daily for suppression.   acyclovir  cream (ZOVIRAX ) 5 % Apply 1 Application topically every 3 (three) hours. May use for up to 7 days   acyclovir  ointment (ZOVIRAX ) 5 % Apply topically every 3 (three) hours.   albuterol  (VENTOLIN  HFA) 108 (90 Base) MCG/ACT inhaler Inhale 1-2 puffs into the lungs every 6 (six) hours as needed.   cyclobenzaprine  (FLEXERIL ) 10 MG tablet Take 1 tablet (10 mg total) by mouth at bedtime for headaches.   ibuprofen  (ADVIL ,MOTRIN ) 200 MG tablet Take 600-800 mg by mouth as needed (for cramps).   L-Lysine 1000 MG TABS Take 1,000 mg by mouth daily.   Multiple Vitamin (MULTIVITAMIN) capsule Take by mouth.   naproxen sodium (ALEVE) 220 MG tablet Take 440 mg by mouth as needed.   norethindrone  (AYGESTIN ) 5 MG tablet Take 1 tablet (5 mg total) by mouth daily. Take it three times daily (every 8 hours) until the bleeding stops then decrease to once daily   nystatin -triamcinolone  ointment (MYCOLOG) Apply topically 2 (two) times daily.   triamcinolone  cream (KENALOG ) 0.5 % APPLY CREAM TOPICALLY TO AFFECTED AREA 4 TIMES DAILY   valACYclovir  (VALTREX ) 500 MG tablet Take 1 tablet (500 mg total) by mouth 2 (two) times daily. (Take 2 tablets 3 times daily for 1 week then take 1 tablet twice daily for suppression.)   Vitamin D, Cholecalciferol, 25 MCG (1000 UT) TABS Take by mouth. 2,000 units daily   [DISCONTINUED] amLODipine  (NORVASC ) 5 MG tablet Take 1 tablet by mouth once daily   [DISCONTINUED] levothyroxine  (SYNTHROID ) 75 MCG tablet Take 1 tablet by mouth once daily   [  DISCONTINUED] naratriptan  (AMERGE) 2.5 MG tablet Take 1 tablet by mouth at onset of headache if symptoms return or do not resolve may repeat after 4 hours, do not exceed 2 tablets in 24 hours   amLODipine  (NORVASC ) 5 MG tablet Take 1 tablet (5 mg total) by mouth daily.   amoxicillin -clavulanate (AUGMENTIN ) 875-125 MG tablet Take 1 tablet by mouth every 12 (twelve) hours. (Patient not taking: Reported  on 06/12/2024)   levothyroxine  (SYNTHROID ) 75 MCG tablet Take 1 tablet (75 mcg total) by mouth daily.   naratriptan  (AMERGE) 2.5 MG tablet Take 1 tablet by mouth at onset of headache if symptoms return or do not resolve may repeat after 4 hours, do not exceed 2 tablets in 24 hours   promethazine -dextromethorphan (PROMETHAZINE -DM) 6.25-15 MG/5ML syrup Take 5 mLs by mouth 4 (four) times daily as needed for cough. (Patient not taking: Reported on 06/12/2024)   No facility-administered encounter medications on file as of 06/12/2024.    Review of Systems  Review of Systems  Constitutional: Negative.   HENT: Negative.    Cardiovascular: Negative.   Gastrointestinal: Negative.   Allergic/Immunologic: Negative.   Neurological: Negative.   Psychiatric/Behavioral: Negative.       Objective:   BP (!) 145/89   Pulse 88   Temp 98.1 F (36.7 C) (Temporal)   Wt 197 lb (89.4 kg)   LMP 05/30/2024   SpO2 99%   BMI 33.81 kg/m   Wt Readings from Last 5 Encounters:  06/12/24 197 lb (89.4 kg)  04/21/24 205 lb (93 kg)  02/26/24 205 lb 6.4 oz (93.2 kg)  02/10/24 206 lb 9.6 oz (93.7 kg)  12/31/23 206 lb (93.4 kg)     Physical Exam Vitals and nursing note reviewed.  Constitutional:      General: She is not in acute distress.    Appearance: She is well-developed.  Cardiovascular:     Rate and Rhythm: Normal rate and regular rhythm.  Pulmonary:     Effort: Pulmonary effort is normal.     Breath sounds: Normal breath sounds.  Neurological:     Mental Status: She is alert and oriented to person, place, and time.       Assessment & Plan:   Benign essential HTN -     CBC; Future -     Comprehensive metabolic panel with GFR; Future -     amLODIPine  Besylate; Take 1 tablet (5 mg total) by mouth daily.  Dispense: 30 tablet; Refill: 0  Other specified hypothyroidism -     Levothyroxine  Sodium; Take 1 tablet (75 mcg total) by mouth daily.  Dispense: 30 tablet; Refill: 0  Lipid  screening -     Lipid panel; Future  Thyroid  disorder screen -     Thyroid  Panel With TSH; Future  Genital herpes simplex, unspecified site  Other orders -     Naratriptan  HCl; Take 1 tablet by mouth at onset of headache if symptoms return or do not resolve may repeat after 4 hours, do not exceed 2 tablets in 24 hours  Dispense: 9 tablet; Refill: 0     Return in about 6 months (around 12/10/2024).      Bascom GORMAN Borer, NP 06/12/2024     [1] No Known Allergies [2]  Social History Tobacco Use  Smoking Status Never  Smokeless Tobacco Never   "

## 2024-06-15 ENCOUNTER — Other Ambulatory Visit: Payer: Self-pay

## 2024-06-15 DIAGNOSIS — Z1329 Encounter for screening for other suspected endocrine disorder: Secondary | ICD-10-CM

## 2024-06-15 DIAGNOSIS — Z13220 Encounter for screening for lipoid disorders: Secondary | ICD-10-CM

## 2024-06-15 DIAGNOSIS — I10 Essential (primary) hypertension: Secondary | ICD-10-CM

## 2024-06-16 LAB — CBC
Hematocrit: 31.6 % — ABNORMAL LOW (ref 34.0–46.6)
Hemoglobin: 9.6 g/dL — ABNORMAL LOW (ref 11.1–15.9)
MCH: 23.8 pg — ABNORMAL LOW (ref 26.6–33.0)
MCHC: 30.4 g/dL — ABNORMAL LOW (ref 31.5–35.7)
MCV: 78 fL — ABNORMAL LOW (ref 79–97)
Platelets: 478 x10E3/uL — ABNORMAL HIGH (ref 150–450)
RBC: 4.03 x10E6/uL (ref 3.77–5.28)
RDW: 14.4 % (ref 11.7–15.4)
WBC: 4.9 x10E3/uL (ref 3.4–10.8)

## 2024-06-16 LAB — COMPREHENSIVE METABOLIC PANEL WITH GFR
ALT: 53 IU/L — ABNORMAL HIGH (ref 0–32)
AST: 31 IU/L (ref 0–40)
Albumin: 4.2 g/dL (ref 3.9–4.9)
Alkaline Phosphatase: 51 IU/L (ref 41–116)
BUN/Creatinine Ratio: 7 — ABNORMAL LOW (ref 9–23)
BUN: 8 mg/dL (ref 6–24)
Bilirubin Total: 0.7 mg/dL (ref 0.0–1.2)
CO2: 19 mmol/L — ABNORMAL LOW (ref 20–29)
Calcium: 9.1 mg/dL (ref 8.7–10.2)
Chloride: 102 mmol/L (ref 96–106)
Creatinine, Ser: 1.08 mg/dL — ABNORMAL HIGH (ref 0.57–1.00)
Globulin, Total: 2.8 g/dL (ref 1.5–4.5)
Glucose: 101 mg/dL — ABNORMAL HIGH (ref 70–99)
Potassium: 4.1 mmol/L (ref 3.5–5.2)
Sodium: 136 mmol/L (ref 134–144)
Total Protein: 7 g/dL (ref 6.0–8.5)
eGFR: 67 mL/min/1.73

## 2024-06-16 LAB — THYROID PANEL WITH TSH
Free Thyroxine Index: 2.2 (ref 1.2–4.9)
T3 Uptake Ratio: 27 % (ref 24–39)
T4, Total: 8.2 ug/dL (ref 4.5–12.0)
TSH: 2.11 u[IU]/mL (ref 0.450–4.500)

## 2024-06-16 LAB — LIPID PANEL
Chol/HDL Ratio: 3.2 ratio (ref 0.0–4.4)
Cholesterol, Total: 126 mg/dL (ref 100–199)
HDL: 39 mg/dL — ABNORMAL LOW
LDL Chol Calc (NIH): 72 mg/dL (ref 0–99)
Triglycerides: 72 mg/dL (ref 0–149)
VLDL Cholesterol Cal: 15 mg/dL (ref 5–40)

## 2024-06-17 ENCOUNTER — Ambulatory Visit: Payer: Self-pay | Admitting: Nurse Practitioner

## 2024-10-12 ENCOUNTER — Encounter

## 2024-10-12 ENCOUNTER — Other Ambulatory Visit

## 2024-12-10 ENCOUNTER — Ambulatory Visit: Payer: Self-pay | Admitting: Nurse Practitioner
